# Patient Record
Sex: Male | Born: 1960 | Race: Black or African American | Hispanic: No | Marital: Single | Smoking: Never smoker
Health system: Southern US, Community
[De-identification: ages and names within clinical notes are randomized; demographics above are authoritative.]

## PROBLEM LIST (undated history)

## (undated) DIAGNOSIS — K4090 Unilateral inguinal hernia, without obstruction or gangrene, not specified as recurrent: Secondary | ICD-10-CM

## (undated) DIAGNOSIS — N529 Male erectile dysfunction, unspecified: Secondary | ICD-10-CM

## (undated) DIAGNOSIS — C61 Malignant neoplasm of prostate: Secondary | ICD-10-CM

## (undated) DIAGNOSIS — D696 Thrombocytopenia, unspecified: Secondary | ICD-10-CM

## (undated) DIAGNOSIS — I10 Essential (primary) hypertension: Secondary | ICD-10-CM

## (undated) HISTORY — PX: HERNIA REPAIR: SHX51

## (undated) HISTORY — DX: Essential (primary) hypertension: I10

## (undated) HISTORY — PX: PROSTATE BIOPSY: SHX241

---

## 2005-11-25 ENCOUNTER — Ambulatory Visit (HOSPITAL_BASED_OUTPATIENT_CLINIC_OR_DEPARTMENT_OTHER): Admission: RE | Admit: 2005-11-25 | Discharge: 2005-11-25 | Payer: Self-pay | Admitting: Surgery

## 2014-03-09 ENCOUNTER — Emergency Department (HOSPITAL_BASED_OUTPATIENT_CLINIC_OR_DEPARTMENT_OTHER)
Admission: EM | Admit: 2014-03-09 | Discharge: 2014-03-09 | Disposition: A | Discharge status: Home / Self Care | Payer: Managed Care, Other (non HMO) | Attending: Emergency Medicine | Admitting: Emergency Medicine

## 2014-03-09 ENCOUNTER — Encounter (HOSPITAL_BASED_OUTPATIENT_CLINIC_OR_DEPARTMENT_OTHER): Payer: Self-pay | Admitting: Emergency Medicine

## 2014-03-09 DIAGNOSIS — R197 Diarrhea, unspecified: Secondary | ICD-10-CM | POA: Insufficient documentation

## 2014-03-09 LAB — RAPID HIV SCREEN (WH-MAU): SUDS RAPID HIV SCREEN: NONREACTIVE

## 2014-03-09 LAB — CBC WITH DIFFERENTIAL/PLATELET
BASOS PCT: 0 % (ref 0–1)
Basophils Absolute: 0 10*3/uL (ref 0.0–0.1)
EOS ABS: 0.2 10*3/uL (ref 0.0–0.7)
Eosinophils Relative: 2 % (ref 0–5)
HCT: 38.5 % — ABNORMAL LOW (ref 39.0–52.0)
Hemoglobin: 12.8 g/dL — ABNORMAL LOW (ref 13.0–17.0)
Lymphocytes Relative: 8 % — ABNORMAL LOW (ref 12–46)
Lymphs Abs: 0.8 10*3/uL (ref 0.7–4.0)
MCH: 30.4 pg (ref 26.0–34.0)
MCHC: 33.2 g/dL (ref 30.0–36.0)
MCV: 91.4 fL (ref 78.0–100.0)
MONO ABS: 0.6 10*3/uL (ref 0.1–1.0)
Monocytes Relative: 6 % (ref 3–12)
NEUTROS ABS: 8.2 10*3/uL — AB (ref 1.7–7.7)
NEUTROS PCT: 84 % — AB (ref 43–77)
Platelets: 524 10*3/uL — ABNORMAL HIGH (ref 150–400)
RBC: 4.21 MIL/uL — ABNORMAL LOW (ref 4.22–5.81)
RDW: 12.9 % (ref 11.5–15.5)
WBC: 9.8 10*3/uL (ref 4.0–10.5)

## 2014-03-09 LAB — COMPREHENSIVE METABOLIC PANEL
ALBUMIN: 3.2 g/dL — AB (ref 3.5–5.2)
ALT: 46 U/L (ref 0–53)
ANION GAP: 12 (ref 5–15)
AST: 32 U/L (ref 0–37)
Alkaline Phosphatase: 65 U/L (ref 39–117)
BILIRUBIN TOTAL: 0.5 mg/dL (ref 0.3–1.2)
BUN: 9 mg/dL (ref 6–23)
CO2: 26 mEq/L (ref 19–32)
CREATININE: 1.2 mg/dL (ref 0.50–1.35)
Calcium: 9.4 mg/dL (ref 8.4–10.5)
Chloride: 105 mEq/L (ref 96–112)
GFR calc Af Amer: 78 mL/min — ABNORMAL LOW (ref >90)
GFR calc non Af Amer: 67 mL/min — ABNORMAL LOW (ref >90)
Glucose, Bld: 102 mg/dL — ABNORMAL HIGH (ref 70–99)
POTASSIUM: 3.6 meq/L — AB (ref 3.7–5.3)
Sodium: 143 mEq/L (ref 137–147)
TOTAL PROTEIN: 7.3 g/dL (ref 6.0–8.3)

## 2014-03-09 MED ORDER — POTASSIUM CHLORIDE CRYS ER 20 MEQ PO TBCR
40.0000 meq | EXTENDED_RELEASE_TABLET | Freq: Once | ORAL | Status: AC
  Administered 2014-03-09: 40 meq via ORAL
  Filled 2014-03-09: qty 2

## 2014-03-09 NOTE — ED Notes (Signed)
Pt has had diarrhea x3 weeks and has been seen several times for the same. Pt sts he was placed on antibiotics which he finished 1 week ago. Diarrhea returned this past Saturday.

## 2014-03-09 NOTE — Discharge Instructions (Signed)
Chronic Diarrhea Diarrhea is frequent loose and watery bowel movements. It can cause you to feel weak and dehydrated. Dehydration can cause you to become tired and thirsty and to have a dry mouth, decreased urination, and dark yellow urine. Diarrhea is a sign of another problem, most often an infection that will not last long. In most cases, diarrhea lasts 2-3 days. Diarrhea that lasts longer than 4 weeks is called long-lasting (chronic) diarrhea. It is important to treat your diarrhea as directed by your health care provider to lessen or prevent future episodes of diarrhea.  CAUSES  There are many causes of chronic diarrhea. The following are some possible causes:   Gastrointestinal infections caused by viruses, bacteria, or parasites.   Food poisoning or food allergies.   Certain medicines, such as antibiotics, chemotherapy, and laxatives.   Artificial sweeteners and fructose.   Digestive disorders, such as celiac disease and inflammatory bowel diseases.   Irritable bowel syndrome.  Some disorders of the pancreas.  Disorders of the thyroid.  Reduced blood flow to the intestines.  Cancer. Sometimes the cause of chronic diarrhea is unknown. RISK FACTORS  Having a severely weakened immune system, such as from HIV or AIDS.   Taking certain types of cancer-fighting drugs (such as with chemotherapy) or other medicines.   Having had a recent organ transplant.   Having a portion of the stomach or small bowel removed.   Traveling to countries where food and water supplies are often contaminated.  SYMPTOMS  In addition to frequent, loose stools, diarrhea may cause:   Cramping.   Abdominal pain.   Nausea.   Fever.  Fatigue.  Urgent need to use the bathroom.  Loss of bowel control. DIAGNOSIS  Your health care provider must take a careful history and perform a physical exam. Tests given are based on your symptoms and history. Tests may include:   Blood or  stool tests. Three or more stool samples may be examined. Stool cultures may be used to test for bacteria or parasites.   X-rays.   A procedure in which a thin tube is inserted into the mouth or rectum (endoscopy). This allows the health care provider to look inside the intestine.  TREATMENT   Treatment is aimed at correcting the cause of the diarrhea when possible.  Diarrhea caused by an infection can often be treated with antibiotic medicines.  Diarrhea not caused by an infection may require you to take long-term medicine or have surgery. Specific treatment should be discussed with your health care provider.  If the cause cannot be determined, treatment aims to relieve symptoms and prevent dehydration. Serious health problems can occur if you do not maintain proper fluid levels. Treatment may include:  Taking an oral rehydration solution (ORS).  Not drinking beverages that contain caffeine (such as tea, coffee, and soft drinks).  Not drinking alcohol.  Maintaining well-balanced nutrition to help you recover faster. HOME CARE INSTRUCTIONS   Drink enough fluids to keep urine clear or pale yellow. Drink 1 cup (8 oz) of fluid for each diarrhea episode. Avoid fluids that contain simple sugars, fruit juices, whole milk products, and sodas. Hydrate with an ORS. You may purchase the ORS or prepare it at home by mixing the following ingredients together:   - tsp (1.7-3  mL) table salt.   tsp (3  mL) baking soda.   tsp (1.7 mL) salt substitute containing potassium chloride.  1 tbsp (20 mL) sugar.  4.2 c (1 L) of water.  Certain foods and beverages may increase the speed at which food moves through the gastrointestinal (GI) tract. These foods and beverages should be avoided. They include:  Caffeinated and alcoholic beverages.  High-fiber foods, such as raw fruits and vegetables, nuts, seeds, and whole grain breads and cereals.  Foods and beverages sweetened with sugar  alcohols, such as xylitol, sorbitol, and mannitol.   Some foods may be well tolerated and may help thicken stool. These include:  Starchy foods, such as rice, toast, pasta, low-sugar cereal, oatmeal, grits, baked potatoes, crackers, and bagels.  Bananas.  Applesauce.  Add probiotic-rich foods to help increase healthy bacteria in the GI tract. These include yogurt and fermented milk products.  Wash your hands well after each diarrhea episode.  Only take over-the-counter or prescription medicines as directed by your health care provider.  Take a warm bath to relieve any burning or pain from frequent diarrhea episodes. SEEK MEDICAL CARE IF:   You are not urinating as often.  Your urine is a dark color.  You become very tired or dizzy.  You have severe pain in the abdomen or rectum.  Your have blood or pus in your stools.  Your stools look black and tarry. SEEK IMMEDIATE MEDICAL CARE IF:   You are unable to keep fluids down.  You have persistent vomiting.  You have blood in your stool.  Your stools are black and tarry.  You do not urinate in 6-8 hours, or there is only a small amount of very dark urine.  You have abdominal pain that increases or localizes.  You have weakness, dizziness, confusion, or lightheadedness.  You have a severe headache.  Your diarrhea gets worse or does not get better.  You have a fever or persistent symptoms for more than 2-3 days.  You have a fever and your symptoms suddenly get worse. MAKE SURE YOU:   Understand these instructions.  Will watch your condition.  Will get help right away if you are not doing well or get worse. Document Released: 09/21/2003 Document Revised: 07/06/2013 Document Reviewed: 12/24/2012 [facility] Patient Information 2015 [place], [place]. This information is not intended to replace advice given to you by your health care provider. Make sure you discuss any questions you have with your health care  provider.  Clostridium Difficile Toxin This is a test which may be done when a patient has diarrhea that lasts for several days, or has abdominal pain, fever, and nausea after antibiotic therapy.  This test looks for the presence of Clostridium difficile (C.diff.) toxin in a stool sample. C.diff. is a germ (bacterium) that is one of the groups of bacteria that are usually in the colon, called "normal flora." If something upsets the growth of the other normal flora, C.diff. may overgrow and disrupt the balance of bacteria in the colon. C. diff. may produce two toxins, A and B. The combination of overgrowth and toxins may cause prolonged diarrhea. The toxins may damage the lining of the colon and lead to colitis.  While some cases of C. diff. diarrhea and colitis do not require treatment, others require specific oral antibiotic therapy. Most patients improve as the normal flora re-establishes itself, but about some may have one or more relapses, with symptoms and detectible toxin levels coming back. PREPARATION FOR TEST There is no special preparation for the test. A fresh stool sample is collected in a sterile container. The sample should not be mixed with urine or water. The stool should be taken to the lab  within an hour. It may be refrigerated or frozen and taken to the lab as soon as possible. The container should be labeled with your name and the date and time of the stool collection.  NORMAL FINDINGS Negative Tissue Culture (no toxin identified) Ranges for normal findings may vary among different laboratories and hospitals. You should always check with your doctor after having lab work or other tests done to discuss the meaning of your test results and whether your values are considered within normal limits. MEANING OF TEST  Your caregiver will go over the test results with you and discuss the importance and meaning of your results, as well as treatment options and the need for additional tests if  necessary. OBTAINING THE TEST RESULTS It is your responsibility to obtain your test results. Ask the lab or department performing the test when and how you will get your results. Document Released: 07/24/2004 Document Revised: 09/23/2011 Document Reviewed: 06/09/2008 [facility] Patient Information 2015 [place], [place]. This information is not intended to replace advice given to you by your health care provider. Make sure you discuss any questions you have with your health care provider.

## 2014-03-09 NOTE — ED Notes (Signed)
MD at bedside. 

## 2014-03-09 NOTE — ED Provider Notes (Signed)
CSN: [id number]     Arrival date & time 03/09/14  1226 History   First MD Initiated Contact with Patient 03/09/14 1253     Chief Complaint  Patient presents with  . Diarrhea     (Consider location/radiation/quality/duration/timing/severity/associated sxs/prior Treatment) Patient is a 53 y.o. male presenting with diarrhea. The history is provided by the patient. No language interpreter was used.  Diarrhea Quality:  Watery Onset quality:  Gradual Number of episodes:  2-10 Duration:  3 weeks Timing:  Constant Progression since onset: waxing/waning. Relieved by: improved after a 3 days of cipro. Associated symptoms: no abdominal pain, no fever, no headaches and no vomiting   Associated symptoms comment:  Had crampy abdominal pain, nausea, subjective fever and chills about 3 weeks ago  Risk factors: recent antibiotic use and suspect food intake   Risk factors: no sick contacts and no travel to endemic areas     History reviewed. No pertinent past medical history. Past Surgical History  Procedure Laterality Date  . Hernia repair     No family history on file. History  Substance Use Topics  . Smoking status: Never Smoker   . Smokeless tobacco: Not on file  . Alcohol Use: Yes    Review of Systems  Constitutional: Negative for fever, activity change, appetite change and fatigue.  HENT: Negative for congestion, facial swelling, rhinorrhea and trouble swallowing.   Eyes: Negative for photophobia and pain.  Respiratory: Negative for cough, chest tightness and shortness of breath.   Cardiovascular: Negative for chest pain and leg swelling.  Gastrointestinal: Positive for diarrhea. Negative for nausea, vomiting, abdominal pain and constipation.  Endocrine: Negative for polydipsia and polyuria.  Genitourinary: Negative for dysuria, urgency, decreased urine volume and difficulty urinating.  Musculoskeletal: Negative for back pain and gait problem.  Skin: Negative for color change,  rash and wound.  Allergic/Immunologic: Negative for immunocompromised state.  Neurological: Negative for dizziness, facial asymmetry, speech difficulty, weakness, numbness and headaches.  Psychiatric/Behavioral: Negative for confusion, decreased concentration and agitation.      Allergies  Review of patient's allergies indicates no known allergies.  Home Medications   Prior to Admission medications   Not on File   BP 118/77  Pulse 84  Temp(Src) 98.2 F (36.8 C) (Oral)  Resp 16  Ht 6\' 4"  (1.93 m)  Wt 220 lb (99.791 kg)  BMI 26.79 kg/m2  SpO2 97% Physical Exam  Constitutional: He is oriented to person, place, and time. He appears well-developed and well-nourished. No distress.  HENT:  Head: Normocephalic and atraumatic.  Mouth/Throat: No oropharyngeal exudate.  Eyes: Pupils are equal, round, and reactive to light.  Neck: Normal range of motion. Neck supple.  Cardiovascular: Normal rate, regular rhythm and normal heart sounds.  Exam reveals no gallop and no friction rub.   No murmur heard. Pulmonary/Chest: Effort normal and breath sounds normal. No respiratory distress. He has no wheezes. He has no rales.  Abdominal: Soft. Bowel sounds are normal. He exhibits no distension and no mass. There is no tenderness. There is no rebound and no guarding.  Musculoskeletal: Normal range of motion. He exhibits no edema and no tenderness.  Neurological: He is alert and oriented to person, place, and time.  Skin: Skin is warm and dry.  Psychiatric: He has a normal mood and affect.    ED Course  Procedures (including critical care time) Labs Review Labs Reviewed  CBC WITH DIFFERENTIAL - Abnormal; Notable for the following:    RBC 4.21 (*)  Hemoglobin 12.8 (*)    HCT 38.5 (*)    Platelets 524 (*)    Neutrophils Relative % 84 (*)    Neutro Abs 8.2 (*)    Lymphocytes Relative 8 (*)    All other components within normal limits  COMPREHENSIVE METABOLIC PANEL - Abnormal; Notable for  the following:    Potassium 3.6 (*)    Glucose, Bld 102 (*)    Albumin 3.2 (*)    GFR calc non Af Amer 67 (*)    GFR calc Af Amer 78 (*)    All other components within normal limits  CLOSTRIDIUM DIFFICILE BY PCR  STOOL CULTURE  OVA AND PARASITE EXAMINATION  RAPID HIV SCREEN ([facility])    Imaging Review No results found.   EKG Interpretation None      MDM   Final diagnoses:  Diarrhea    Pt is a 54 y.o. male with Pmhx as above who presents with 3 weeks of watery d/a. This had resolved for a few days after a 3d course or cipro, but has reoccurred again 1 week ago, though not a severe. He reports abdominal cramping, fever, chills at beginning of illness.  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 He ate hibachi chicken the day symptoms started. No unusual water sources. On PE, VSS, pt in NAD. No abdominal ttp. CBC, CMP, rapid HIV stent. Will send stool cultures and cl diff if able to given staple.    K mildly low at 3.6, will replace given I suspect he will continue to have diarrhea. WBC nml, HIV nonreactive. Pt unable to give stool sample. Will d/c home with plan to return stool sample for giardia, EIA, O/P, Cl diff, culture. Return precautions given for new or worsening symptoms including fever, bloody stool, worsening pain. As he is well-appearing, and d/a seems to be improving, will not start further abx w/o stool testing.         Ernestina Patches, MD 03/09/14 [phone]

## 2016-01-12 ENCOUNTER — Ambulatory Visit: Payer: Self-pay | Admitting: General Surgery

## 2016-02-13 ENCOUNTER — Encounter (HOSPITAL_BASED_OUTPATIENT_CLINIC_OR_DEPARTMENT_OTHER): Payer: Self-pay | Admitting: *Deleted

## 2016-02-15 NOTE — Progress Notes (Signed)
Spoke with Mike Mullen at Dr. Richarda Blade office - will have to do labs day of surgery since pt lives in [place]. - ok to not have CXR done since he is first case of the day. Pt will need CBC,Diff, BMet and Ekg day of surgery.

## 2016-02-16 ENCOUNTER — Other Ambulatory Visit: Payer: Self-pay

## 2016-02-16 ENCOUNTER — Encounter (HOSPITAL_BASED_OUTPATIENT_CLINIC_OR_DEPARTMENT_OTHER)
Admission: RE | Admit: 2016-02-16 | Discharge: 2016-02-16 | Disposition: A | Discharge status: Home / Self Care | Payer: Managed Care, Other (non HMO) | Source: Ambulatory Visit | Attending: General Surgery | Admitting: General Surgery

## 2016-02-16 ENCOUNTER — Ambulatory Visit
Admission: RE | Admit: 2016-02-16 | Discharge: 2016-02-16 | Disposition: A | Discharge status: Home / Self Care | Payer: Managed Care, Other (non HMO) | Source: Ambulatory Visit | Attending: General Surgery | Admitting: General Surgery

## 2016-02-16 ENCOUNTER — Other Ambulatory Visit: Payer: Self-pay | Admitting: General Surgery

## 2016-02-16 DIAGNOSIS — K4090 Unilateral inguinal hernia, without obstruction or gangrene, not specified as recurrent: Secondary | ICD-10-CM | POA: Diagnosis present

## 2016-02-16 DIAGNOSIS — Z01811 Encounter for preprocedural respiratory examination: Secondary | ICD-10-CM

## 2016-02-16 LAB — CBC WITH DIFFERENTIAL/PLATELET
BASOS ABS: 0 10*3/uL (ref 0.0–0.1)
Basophils Relative: 0 %
EOS PCT: 2 %
Eosinophils Absolute: 0.1 10*3/uL (ref 0.0–0.7)
HCT: 43.8 % (ref 39.0–52.0)
Hemoglobin: 14.5 g/dL (ref 13.0–17.0)
Lymphocytes Relative: 22 %
Lymphs Abs: 1.2 10*3/uL (ref 0.7–4.0)
MCH: 30.3 pg (ref 26.0–34.0)
MCHC: 33.1 g/dL (ref 30.0–36.0)
MCV: 91.6 fL (ref 78.0–100.0)
MONO ABS: 0.6 10*3/uL (ref 0.1–1.0)
MONOS PCT: 11 %
NEUTROS ABS: 3.6 10*3/uL (ref 1.7–7.7)
NEUTROS PCT: 65 %
PLATELETS: 654 10*3/uL — AB (ref 150–400)
RBC: 4.78 MIL/uL (ref 4.22–5.81)
RDW: 12.8 % (ref 11.5–15.5)
WBC: 5.5 10*3/uL (ref 4.0–10.5)

## 2016-02-16 LAB — BASIC METABOLIC PANEL
ANION GAP: 7 (ref 5–15)
BUN: 11 mg/dL (ref 6–20)
CHLORIDE: 104 mmol/L (ref 101–111)
CO2: 27 mmol/L (ref 22–32)
CREATININE: 0.9 mg/dL (ref 0.61–1.24)
Calcium: 9.1 mg/dL (ref 8.9–10.3)
GFR calc non Af Amer: >60 mL/min (ref >60)
Glucose, Bld: 72 mg/dL (ref 65–99)
POTASSIUM: 4 mmol/L (ref 3.5–5.1)
SODIUM: 138 mmol/L (ref 135–145)

## 2016-02-19 ENCOUNTER — Ambulatory Visit (HOSPITAL_BASED_OUTPATIENT_CLINIC_OR_DEPARTMENT_OTHER)
Admission: RE | Admit: 2016-02-19 | Discharge: 2016-02-19 | Disposition: A | Discharge status: Home / Self Care | Payer: Managed Care, Other (non HMO) | Source: Ambulatory Visit | Attending: General Surgery | Admitting: General Surgery

## 2016-02-19 ENCOUNTER — Encounter (HOSPITAL_BASED_OUTPATIENT_CLINIC_OR_DEPARTMENT_OTHER)
Admission: RE | Disposition: A | Discharge status: Home / Self Care | Payer: Self-pay | Source: Ambulatory Visit | Attending: General Surgery

## 2016-02-19 ENCOUNTER — Encounter (HOSPITAL_BASED_OUTPATIENT_CLINIC_OR_DEPARTMENT_OTHER): Payer: Self-pay | Admitting: *Deleted

## 2016-02-19 ENCOUNTER — Ambulatory Visit (HOSPITAL_BASED_OUTPATIENT_CLINIC_OR_DEPARTMENT_OTHER): Payer: Managed Care, Other (non HMO) | Admitting: Anesthesiology

## 2016-02-19 DIAGNOSIS — K4090 Unilateral inguinal hernia, without obstruction or gangrene, not specified as recurrent: Secondary | ICD-10-CM | POA: Diagnosis not present

## 2016-02-19 DIAGNOSIS — Z01818 Encounter for other preprocedural examination: Secondary | ICD-10-CM

## 2016-02-19 HISTORY — DX: Unilateral inguinal hernia, without obstruction or gangrene, not specified as recurrent: K40.90

## 2016-02-19 HISTORY — PX: INSERTION OF MESH: SHX5868

## 2016-02-19 HISTORY — PX: INGUINAL HERNIA REPAIR: SHX194

## 2016-02-19 SURGERY — REPAIR, HERNIA, INGUINAL, ADULT
Anesthesia: Regional | Site: Inguinal | Laterality: Left

## 2016-02-19 MED ORDER — ACETAMINOPHEN 500 MG PO TABS
1000.0000 mg | ORAL_TABLET | ORAL | Status: AC
  Administered 2016-02-19: 1000 mg via ORAL

## 2016-02-19 MED ORDER — MIDAZOLAM HCL 2 MG/2ML IJ SOLN: 1.0000 mg | INTRAMUSCULAR | Status: DC | PRN

## 2016-02-19 MED ORDER — SODIUM BICARBONATE 4 % IV SOLN
INTRAVENOUS | Status: AC
  Filled 2016-02-19: qty 5

## 2016-02-19 MED ORDER — LIDOCAINE 2% (20 MG/ML) 5 ML SYRINGE
INTRAMUSCULAR | Status: AC
  Filled 2016-02-19: qty 5

## 2016-02-19 MED ORDER — LIDOCAINE HCL (CARDIAC) 20 MG/ML IV SOLN
INTRAVENOUS | Status: DC | PRN
  Administered 2016-02-19: 100 mg via INTRAVENOUS

## 2016-02-19 MED ORDER — ONDANSETRON HCL 4 MG/2ML IJ SOLN
INTRAMUSCULAR | Status: AC
  Filled 2016-02-19: qty 2

## 2016-02-19 MED ORDER — PROPOFOL 500 MG/50ML IV EMUL
INTRAVENOUS | Status: DC | PRN
  Administered 2016-02-19: 50 mL via INTRAVENOUS
  Administered 2016-02-19: 300 mL via INTRAVENOUS

## 2016-02-19 MED ORDER — FENTANYL CITRATE (PF) 100 MCG/2ML IJ SOLN
50.0000 ug | INTRAMUSCULAR | Status: DC | PRN
  Administered 2016-02-19: 50 ug via INTRAVENOUS

## 2016-02-19 MED ORDER — PROPOFOL 10 MG/ML IV BOLUS
INTRAVENOUS | Status: AC
  Filled 2016-02-19: qty 40

## 2016-02-19 MED ORDER — OXYCODONE HCL 5 MG/5ML PO SOLN: 5.0000 mg | Freq: Once | ORAL | Status: AC | PRN

## 2016-02-19 MED ORDER — MIDAZOLAM HCL 2 MG/2ML IJ SOLN
INTRAMUSCULAR | Status: AC
  Filled 2016-02-19: qty 2

## 2016-02-19 MED ORDER — LIDOCAINE HCL (PF) 1 % IJ SOLN
INTRAMUSCULAR | Status: AC
  Filled 2016-02-19: qty 30

## 2016-02-19 MED ORDER — DEXAMETHASONE SODIUM PHOSPHATE 10 MG/ML IJ SOLN
INTRAMUSCULAR | Status: AC
  Filled 2016-02-19: qty 1

## 2016-02-19 MED ORDER — BUPIVACAINE HCL (PF) 0.5 % IJ SOLN
INTRAMUSCULAR | Status: DC | PRN
  Administered 2016-02-19: 18 mL

## 2016-02-19 MED ORDER — FENTANYL CITRATE (PF) 100 MCG/2ML IJ SOLN
INTRAMUSCULAR | Status: AC
  Filled 2016-02-19: qty 2

## 2016-02-19 MED ORDER — SCOPOLAMINE 1 MG/3DAYS TD PT72
MEDICATED_PATCH | TRANSDERMAL | Status: AC
  Filled 2016-02-19: qty 1

## 2016-02-19 MED ORDER — CHLORHEXIDINE GLUCONATE CLOTH 2 % EX PADS: 6.0000 | MEDICATED_PAD | Freq: Once | CUTANEOUS | Status: DC

## 2016-02-19 MED ORDER — SCOPOLAMINE 1 MG/3DAYS TD PT72: 1.0000 | MEDICATED_PATCH | Freq: Once | TRANSDERMAL | Status: DC | PRN

## 2016-02-19 MED ORDER — CEFAZOLIN SODIUM-DEXTROSE 2-4 GM/100ML-% IV SOLN
2.0000 g | INTRAVENOUS | Status: AC
  Administered 2016-02-19: 2 g via INTRAVENOUS

## 2016-02-19 MED ORDER — MIDAZOLAM HCL 5 MG/5ML IJ SOLN
INTRAMUSCULAR | Status: DC | PRN
  Administered 2016-02-19: 2 mg via INTRAVENOUS

## 2016-02-19 MED ORDER — CELECOXIB 200 MG PO CAPS
ORAL_CAPSULE | ORAL | Status: AC
  Filled 2016-02-19: qty 2

## 2016-02-19 MED ORDER — FENTANYL CITRATE (PF) 100 MCG/2ML IJ SOLN
INTRAMUSCULAR | Status: DC | PRN
  Administered 2016-02-19: 100 ug via INTRAVENOUS
  Administered 2016-02-19: 50 ug via INTRAVENOUS

## 2016-02-19 MED ORDER — LACTATED RINGERS IV SOLN
INTRAVENOUS | Status: DC
  Administered 2016-02-19 (×2): via INTRAVENOUS

## 2016-02-19 MED ORDER — OXYCODONE HCL 5 MG PO TABS
5.0000 mg | ORAL_TABLET | Freq: Once | ORAL | Status: AC | PRN
  Administered 2016-02-19: 5 mg via ORAL

## 2016-02-19 MED ORDER — DEXAMETHASONE SODIUM PHOSPHATE 10 MG/ML IJ SOLN
INTRAMUSCULAR | Status: DC | PRN
  Administered 2016-02-19: 10 mg via INTRAVENOUS

## 2016-02-19 MED ORDER — GLYCOPYRROLATE 0.2 MG/ML IJ SOLN: 0.2000 mg | Freq: Once | INTRAMUSCULAR | Status: DC | PRN

## 2016-02-19 MED ORDER — ONDANSETRON HCL 4 MG/2ML IJ SOLN
INTRAMUSCULAR | Status: DC | PRN
Start: 2016-02-19 — End: 2016-02-19
  Administered 2016-02-19: 4 mg via INTRAVENOUS

## 2016-02-19 MED ORDER — FENTANYL CITRATE (PF) 100 MCG/2ML IJ SOLN
INTRAMUSCULAR | Status: AC
  Filled 2016-02-19: qty 4

## 2016-02-19 MED ORDER — ACETAMINOPHEN 500 MG PO TABS
ORAL_TABLET | ORAL | Status: AC
  Filled 2016-02-19: qty 2

## 2016-02-19 MED ORDER — HYDROCODONE-ACETAMINOPHEN 5-325 MG PO TABS: 1.0000 | ORAL_TABLET | Freq: Four times a day (QID) | ORAL | 0 refills | Status: DC | PRN

## 2016-02-19 MED ORDER — CEFAZOLIN SODIUM-DEXTROSE 2-4 GM/100ML-% IV SOLN
INTRAVENOUS | Status: AC
  Filled 2016-02-19: qty 100

## 2016-02-19 MED ORDER — HYDROMORPHONE HCL 1 MG/ML IJ SOLN: 0.2500 mg | INTRAMUSCULAR | Status: DC | PRN

## 2016-02-19 MED ORDER — BUPIVACAINE HCL (PF) 0.5 % IJ SOLN
INTRAMUSCULAR | Status: AC
  Filled 2016-02-19: qty 30

## 2016-02-19 MED ORDER — MEPERIDINE HCL 25 MG/ML IJ SOLN: 6.2500 mg | INTRAMUSCULAR | Status: DC | PRN

## 2016-02-19 MED ORDER — SODIUM CHLORIDE 0.9 % IR SOLN
Status: DC | PRN
  Administered 2016-02-19: 200 mL

## 2016-02-19 MED ORDER — CELECOXIB 400 MG PO CAPS
400.0000 mg | ORAL_CAPSULE | ORAL | Status: AC
  Administered 2016-02-19: 400 mg via ORAL

## 2016-02-19 MED ORDER — PROMETHAZINE HCL 25 MG/ML IJ SOLN: 6.2500 mg | INTRAMUSCULAR | Status: DC | PRN

## 2016-02-19 MED ORDER — OXYCODONE HCL 5 MG PO TABS
ORAL_TABLET | ORAL | Status: AC
  Filled 2016-02-19: qty 1

## 2016-02-19 SURGICAL SUPPLY — 55 items
BAG DECANTER FOR FLEXI CONT (MISCELLANEOUS) ×3 IMPLANT
BLADE CLIPPER SURG (BLADE) ×3 IMPLANT
BLADE SURG 10 STRL SS (BLADE) ×3 IMPLANT
BLADE SURG 15 STRL LF DISP TIS (BLADE) ×2 IMPLANT
BLADE SURG 15 STRL SS (BLADE) ×4
CANISTER SUCT 1200ML W/VALVE (MISCELLANEOUS) IMPLANT
CHLORAPREP W/TINT 26ML (MISCELLANEOUS) ×3 IMPLANT
CLEANER CAUTERY TIP 5X5 PAD (MISCELLANEOUS) ×1 IMPLANT
CLOSURE WOUND 1/2 X4 (GAUZE/BANDAGES/DRESSINGS) ×1
COVER BACK TABLE 60X90IN (DRAPES) ×3 IMPLANT
COVER MAYO STAND STRL (DRAPES) ×3 IMPLANT
DECANTER SPIKE VIAL GLASS SM (MISCELLANEOUS) ×3 IMPLANT
DERMABOND ADVANCED (GAUZE/BANDAGES/DRESSINGS) ×2
DERMABOND ADVANCED .7 DNX12 (GAUZE/BANDAGES/DRESSINGS) ×1 IMPLANT
DRAIN PENROSE 1/2X12 LTX STRL (WOUND CARE) ×3 IMPLANT
DRAPE LAPAROTOMY TRNSV 102X78 (DRAPE) ×3 IMPLANT
DRAPE UTILITY XL STRL (DRAPES) ×3 IMPLANT
DRSG TEGADERM 2-3/8X2-3/4 SM (GAUZE/BANDAGES/DRESSINGS) IMPLANT
DRSG TEGADERM 4X4.75 (GAUZE/BANDAGES/DRESSINGS) ×3 IMPLANT
ELECT REM PT RETURN 9FT ADLT (ELECTROSURGICAL) ×3
ELECTRODE REM PT RTRN 9FT ADLT (ELECTROSURGICAL) ×1 IMPLANT
GLOVE BIOGEL PI IND STRL 7.0 (GLOVE) ×2 IMPLANT
GLOVE BIOGEL PI IND STRL 8 (GLOVE) ×1 IMPLANT
GLOVE BIOGEL PI INDICATOR 7.0 (GLOVE) ×4
GLOVE BIOGEL PI INDICATOR 8 (GLOVE) ×2
GLOVE ECLIPSE 7.5 STRL STRAW (GLOVE) ×3 IMPLANT
GLOVE SURG SS PI 6.5 STRL IVOR (GLOVE) ×3 IMPLANT
GOWN STRL REUS W/ TWL LRG LVL3 (GOWN DISPOSABLE) ×1 IMPLANT
GOWN STRL REUS W/ TWL XL LVL3 (GOWN DISPOSABLE) ×1 IMPLANT
GOWN STRL REUS W/TWL LRG LVL3 (GOWN DISPOSABLE) ×2
GOWN STRL REUS W/TWL XL LVL3 (GOWN DISPOSABLE) ×2
MESH HERNIA 3X6 (Mesh General) ×3 IMPLANT
NEEDLE HYPO 25X1 1.5 SAFETY (NEEDLE) ×3 IMPLANT
NS IRRIG 1000ML POUR BTL (IV SOLUTION) IMPLANT
PACK BASIN DAY SURGERY FS (CUSTOM PROCEDURE TRAY) ×3 IMPLANT
PAD CLEANER CAUTERY TIP 5X5 (MISCELLANEOUS) ×2
PENCIL BUTTON HOLSTER BLD 10FT (ELECTRODE) ×3 IMPLANT
SLEEVE SCD COMPRESS KNEE MED (MISCELLANEOUS) ×3 IMPLANT
SPONGE INTESTINAL PEANUT (DISPOSABLE) ×3 IMPLANT
SPONGE LAP 4X18 X RAY DECT (DISPOSABLE) ×3 IMPLANT
STRIP CLOSURE SKIN 1/2X4 (GAUZE/BANDAGES/DRESSINGS) ×2 IMPLANT
SUT ETHIBOND 0 MO6 C/R (SUTURE) ×3 IMPLANT
SUT MON AB 4-0 PC3 18 (SUTURE) ×3 IMPLANT
SUT PROLENE 0 CT 2 (SUTURE) ×6 IMPLANT
SUT VIC AB 3-0 SH 27 (SUTURE) ×4
SUT VIC AB 3-0 SH 27X BRD (SUTURE) ×2 IMPLANT
SUT VICRYL 4-0 PS2 18IN ABS (SUTURE) IMPLANT
SUT VICRYL AB 3 0 TIES (SUTURE) IMPLANT
SYR BULB 3OZ (MISCELLANEOUS) ×3 IMPLANT
SYR CONTROL 10ML LL (SYRINGE) ×3 IMPLANT
TOWEL OR 17X24 6PK STRL BLUE (TOWEL DISPOSABLE) ×3 IMPLANT
TOWEL OR NON WOVEN STRL DISP B (DISPOSABLE) ×3 IMPLANT
TUBE CONNECTING 20'X1/4 (TUBING)
TUBE CONNECTING 20X1/4 (TUBING) IMPLANT
YANKAUER SUCT BULB TIP NO VENT (SUCTIONS) IMPLANT

## 2016-02-19 NOTE — Transfer of Care (Signed)
Immediate Anesthesia Transfer of Care Note  Patient: Mike Mullen  Procedure(s) Performed: Procedure(s): OPEN LEFT INGUINAL HERNIA REPAIR (Left) INSERTION OF MESH (Left)  Patient Location: PACU  Anesthesia Type:General  Level of Consciousness: awake, alert , oriented, sedated and patient cooperative  Airway & Oxygen Therapy: Patient Spontanous Breathing and Patient connected to face mask oxygen  Post-op Assessment: Report given to RN and Post -op Vital signs reviewed and stable  Post vital signs: Reviewed and stable  Last Vitals:  Vitals:   02/19/16 0643  BP: (!) 153/84  Pulse: 75  Resp: 20  Temp: 36.8 C    Last Pain:  Vitals:   02/19/16 0643  TempSrc: Oral         Complications: No apparent anesthesia complications

## 2016-02-19 NOTE — H&P (Signed)
Mike Mullen is an 55 y.o. male.   Chief Complaint: Left inguinal hernia  HPI: symptomatic LIH seen in office.  Reducible, previous [facility] repair laparoscopically.  Past Medical History:  Diagnosis Date  . Inguinal hernia    left    Past Surgical History:  Procedure Laterality Date  . HERNIA REPAIR      History reviewed. No pertinent family history. Social History:  reports that he has never smoked. He has never used smokeless tobacco. He reports that he drinks alcohol. He reports that he does not use drugs.  Allergies: No Known Allergies  No prescriptions prior to admission.    No results found for this or any previous visit (from the past 48 hour(s)). No results found.  Review of Systems  All other systems reviewed and are negative.   Blood pressure (!) 153/84, pulse 75, temperature 98.3 F (36.8 C), temperature source Oral, resp. rate 20, height 6\' 4"  (1.93 m), weight 111.1 kg (245 lb), SpO2 100 %. Physical Exam  Constitutional: He is oriented to person, place, and time. He appears well-developed and well-nourished.  HENT:  Head: Normocephalic and atraumatic.  Neck: Normal range of motion. Neck supple.  Cardiovascular: Normal rate, regular rhythm and normal heart sounds.   No murmur heard. Respiratory: Effort normal and breath sounds normal. No respiratory distress.  GI: Soft. Bowel sounds are normal. A hernia is present. Hernia confirmed positive in the left inguinal area.    Musculoskeletal: Normal range of motion.  Neurological: He is alert and oriented to person, place, and time. He has normal reflexes.  Skin: Skin is warm.  Psychiatric: He has a normal mood and affect. His behavior is normal. Judgment and thought content normal.     Assessment/Plan Symptomatic LIH for open repair today. Patient understands the risks and benefits and wishes to proceed.  Judeth Horn, MD 02/19/2016, 7:37 AM

## 2016-02-19 NOTE — Discharge Instructions (Addendum)
PATIENT INSTRUCTIONS HERNIA  FOLLOW-UP:  Please make an appointment with your physician in 2 week(s).  Call your physician immediately if you have any fevers greater than 102.5, drainage from you wound that is not clear or looks infected, persistent bleeding, increasing abdominal pain, problems urinating, or persistent nausea/vomiting.    WOUND CARE INSTRUCTIONS:  You should keep the initial dressing intact until seen in clinic.  You may shower right away.  Leave the plastic intact If the wound becomes bright red and painful or starts to drain infected material that is not clear, please contact your physician immediately.  If the wound is mildly pink and has a thick firm ridge underneath it, this is normal, and is referred to as a healing ridge.  This will resolve over the next 4-6 weeks.  DIET:  You may eat any foods that you can tolerate.  It is a good idea to eat a high fiber diet and take in plenty of fluids to prevent constipation.  If you do become constipated you may want to take a mild laxative or take ducolax tablets on a daily basis until your bowel habits are regular.  Constipation can be very uncomfortable, along with straining, after recent abdominal surgery.  ACTIVITY:  You are encouraged to cough and deep breath or use your incentive spirometer if you were given one, every 15-30 minutes when awake.  This will help prevent respiratory complications and low grade fevers post-operatively.  You may want to hug a pillow when coughing and sneezing to add additional support to the surgical area which will decrease pain during these times.  You are encouraged to walk and engage in light activity for the next two weeks.  You should not lift more than 20 pounds during this time frame as it could put you at increased risk for a hernia recurrence.  Twenty pounds is roughly equivalent to a plastic bag of groceries.    MEDICATIONS:  Try to take narcotic medications and anti-inflammatory medications, such  as tylenol, ibuprofen, naprosyn, etc., with food.  This will minimize stomach upset from the medication.  Should you develop nausea and vomiting from the pain medication, or develop a rash, please discontinue the medication and contact your physician.  You should not drive, make important decisions, or operate machinery when taking narcotic pain medication.  QUESTIONS:  Please feel free to call your physician or the hospital operator if you have any questions, and they will be glad to assist you.    Post Anesthesia Home Care Instructions  Activity: Get plenty of rest for the remainder of the day. A responsible adult should stay with you for 24 hours following the procedure.  For the next 24 hours, DO NOT: -Drive a car -Paediatric nurse -Drink alcoholic beverages -Take any medication unless instructed by your physician -Make any legal decisions or sign important papers.  Meals: Start with liquid foods such as gelatin or soup. Progress to regular foods as tolerated. Avoid greasy, spicy, heavy foods. If nausea and/or vomiting occur, drink only clear liquids until the nausea and/or vomiting subsides. Call your physician if vomiting continues.  Special Instructions/Symptoms: Your throat may feel dry or sore from the anesthesia or the breathing tube placed in your throat during surgery. If this causes discomfort, gargle with warm salt water. The discomfort should disappear within 24 hours.  If you had a scopolamine patch placed behind your ear for the management of post- operative nausea and/or vomiting:  1. The medication in the patch  is effective for 72 hours, after which it should be removed.  Wrap patch in a tissue and discard in the trash. Wash hands thoroughly with soap and water. 2. You may remove the patch earlier than 72 hours if you experience unpleasant side effects which may include dry mouth, dizziness or visual disturbances. 3. Avoid touching the patch. Wash your hands with soap  and water after contact with the patch.   Call your surgeon if you experience:   1.  Fever over 101.0. 2.  Inability to urinate. 3.  Nausea and/or vomiting. 4.  Extreme swelling or bruising at the surgical site. 5.  Continued bleeding from the incision. 6.  Increased pain, redness or drainage from the incision. 7.  Problems related to your pain medication. 8.  Any problems and/or concerns

## 2016-02-19 NOTE — Anesthesia Postprocedure Evaluation (Signed)
Anesthesia Post Note  Patient: Mike Mullen  Procedure(s) Performed: Procedure(s) (LRB): OPEN LEFT INGUINAL HERNIA REPAIR (Left) INSERTION OF MESH (Left)  Patient location during evaluation: PACU Anesthesia Type: General Level of consciousness: sedated and patient cooperative Pain management: pain level controlled Vital Signs Assessment: post-procedure vital signs reviewed and stable Respiratory status: spontaneous breathing Cardiovascular status: stable Anesthetic complications: no    Last Vitals:  Vitals:   02/19/16 0950 02/19/16 1037  BP:  (!) 157/86  Pulse: 94 87  Resp: 13 16  Temp:  37 C    Last Pain:  Vitals:   02/19/16 1037  TempSrc:   PainSc: [place]

## 2016-02-19 NOTE — Anesthesia Procedure Notes (Signed)
Procedure Name: LMA Insertion Date/Time: 02/19/2016 7:47 AM Performed by: Wanita Chamberlain Pre-anesthesia Checklist: Patient identified, Timeout performed, Emergency Drugs available, Suction available and Patient being monitored Patient Re-evaluated:Patient Re-evaluated prior to inductionOxygen Delivery Method: Circle system utilized Preoxygenation: Pre-oxygenation with 100% oxygen Intubation Type: IV induction Ventilation: Mask ventilation without difficulty LMA: LMA inserted LMA Size: 5.0

## 2016-02-19 NOTE — Brief Op Note (Signed)
02/19/2016  9:12 AM  PATIENT:  Mike Mullen  55 y.o. male  PRE-OPERATIVE DIAGNOSIS:  Symptomatic left inguinal hernia  POST-OPERATIVE DIAGNOSIS:  Symptomatic left inguinal hernia  PROCEDURE:  Procedure(s): OPEN LEFT INGUINAL HERNIA REPAIR (Left)  SURGEON:  Surgeon(s) and Role:    * Judeth Horn, MD - Primary  PHYSICIAN ASSISTANT:   ASSISTANTS: none   ANESTHESIA:   general and LMA  EBL:  Total I/O In: 800 [I.V.:800] Out: -   BLOOD ADMINISTERED:none  DRAINS: none   LOCAL MEDICATIONS USED:  MARCAINE     SPECIMEN:  No Specimen  DISPOSITION OF SPECIMEN:  None  COUNTS:  YES  TOURNIQUET:  * No tourniquets in log *  DICTATION: .Dragon Dictation  PLAN OF CARE: Discharge to home after PACU  PATIENT DISPOSITION:  PACU - hemodynamically stable.   Delay start of Pharmacological VTE agent (>24hrs) due to surgical blood loss or risk of bleeding: no

## 2016-02-19 NOTE — Op Note (Signed)
OPERATIVE REPORT  DATE OF OPERATION: 02/19/2016  PATIENT:  Mike Mullen  55 y.o. male  PRE-OPERATIVE DIAGNOSIS:  Symptomatic left inguinal hernia  POST-OPERATIVE DIAGNOSIS:  Symptomatic left inguinal hernia  FINDINGS:  Direct left inguinal hernia  PROCEDURE:  Procedure(s): OPEN LEFT INGUINAL HERNIA REPAIR  SURGEON:  Surgeon(s): Judeth Horn, MD  ASSISTANT: None  ANESTHESIA:   general and LMA  COMPLICATIONS:  None  EBL: <20 ml  BLOOD ADMINISTERED: none  DRAINS: none   SPECIMEN:  No Specimen  COUNTS CORRECT:  YES  PROCEDURE DETAILS: The patient was taken to the operating room and placed on the table in the supine position. After an adequate general laryngeal airway anesthetic was administered he was prepped and draped in usual sterile manner exposing the left inguinal area.  A proper timeout was performed identifying the patient and procedure to be performed which been marked preoperatively. We marked on the patient's skin the area of incision which was a curvilinear incision at the level of the superficial ring. We took it down into the subcutaneous tissue using electrocautery ligating a lateral subcutaneous vein with 3-0 Vicryl. We exposed the external oblique fascia which was then opened to its fibers down through the superficial ring exposing the spermatic cord. The processes opening the superficial ring the ilioinguinal nerve was exposed and we cut it because it was directly involved with inflammation along the hernia itself.  We mobilized the spermatic cord with a Penrose drain and then subsequently identified there was no evidence of an indirect sac but a large direct sac which we subsequently repaired.  In order to keep it out of the way we imbricated the direct sac on itself using 0 Ethibond sutures. We then reinforced the hernia repair placing an oval piece of polypropylene mesh which been soaked in antibiotic solution and measured 3 x 5 cm in size. We attached to the  pubic tubercle and then attached to the reflected portion of the inguinal ligament inferiorly laterally and the conjoined tendon anterior medially.  Once this was done we irrigated with antibiotic solution. The external oblique fascia was closed using running 3-0 Vicryl suture and Scarpa's fascia was reapproximated using interrupted 3-0 Vicryl. 0.25% Marcaine without epinephrine neb from was injected into the wound and at the anterior superior iliac spine. The skin was closed using running subcuticular stitch of 4-0 Monocryl. Dermabond Steri-Strips and Tegaderm used to complete our dressings. All needle counts, sponge counts, and instrument counts were correct.  PATIENT DISPOSITION:  PACU - hemodynamically stable.   Lema Heinkel 8/7/20179:13 AM

## 2016-02-19 NOTE — Anesthesia Preprocedure Evaluation (Addendum)
Anesthesia Evaluation  Patient identified by MRN, date of birth, ID band Patient awake    Reviewed: Allergy & Precautions, NPO status , Patient's Chart, lab work & pertinent test results  Airway Mallampati: II  TM Distance: >3 FB Neck ROM: Full    Dental no notable dental hx. (+) Teeth Intact, Dental Advisory Given   Pulmonary neg pulmonary ROS,    Pulmonary exam normal breath sounds clear to auscultation       Cardiovascular Exercise Tolerance: Good negative cardio ROS Normal cardiovascular exam Rhythm:Regular Rate:Normal     Neuro/Psych negative neurological ROS  negative psych ROS   GI/Hepatic negative GI ROS, Neg liver ROS,   Endo/Other  negative endocrine ROS  Renal/GU negative Renal ROS     Musculoskeletal negative musculoskeletal ROS (+)   Abdominal   Peds  Hematology negative hematology ROS (+)   Anesthesia Other Findings   Reproductive/Obstetrics negative OB ROS                           Anesthesia Physical Anesthesia Plan  ASA: I  Anesthesia Plan: General   Post-op Pain Management:    Induction: Intravenous  Airway Management Planned: LMA  Additional Equipment:   Intra-op Plan:   Post-operative Plan: Extubation in OR  Informed Consent: I have reviewed the patients History and Physical, chart, labs and discussed the procedure including the risks, benefits and alternatives for the proposed anesthesia with the patient or authorized representative who has indicated his/her understanding and acceptance.   Dental advisory given  Plan Discussed with: CRNA and Anesthesiologist  Anesthesia Plan Comments:       Anesthesia Quick Evaluation

## 2016-02-21 ENCOUNTER — Encounter (HOSPITAL_BASED_OUTPATIENT_CLINIC_OR_DEPARTMENT_OTHER): Payer: Self-pay | Admitting: General Surgery

## 2016-09-06 ENCOUNTER — Other Ambulatory Visit: Payer: Self-pay | Admitting: Neurosurgery

## 2016-09-06 DIAGNOSIS — M5416 Radiculopathy, lumbar region: Secondary | ICD-10-CM

## 2016-09-16 ENCOUNTER — Ambulatory Visit
Admission: RE | Admit: 2016-09-16 | Discharge: 2016-09-16 | Disposition: A | Discharge status: Home / Self Care | Payer: BLUE CROSS/BLUE SHIELD | Source: Ambulatory Visit | Attending: Neurosurgery | Admitting: Neurosurgery

## 2016-09-16 DIAGNOSIS — M5416 Radiculopathy, lumbar region: Secondary | ICD-10-CM

## 2016-09-16 MED ORDER — IOPAMIDOL (ISOVUE-M 200) INJECTION 41%
15.0000 mL | Freq: Once | INTRAMUSCULAR | Status: AC
  Administered 2016-09-16: 15 mL via INTRATHECAL

## 2016-09-16 MED ORDER — DIAZEPAM 5 MG PO TABS
10.0000 mg | ORAL_TABLET | Freq: Once | ORAL | Status: AC
  Administered 2016-09-16: 10 mg via ORAL

## 2016-09-16 MED ORDER — ONDANSETRON HCL 4 MG/2ML IJ SOLN: 4.0000 mg | Freq: Four times a day (QID) | INTRAMUSCULAR | Status: DC | PRN

## 2016-09-16 NOTE — Discharge Instructions (Signed)

## 2016-09-18 ENCOUNTER — Other Ambulatory Visit: Payer: Self-pay | Admitting: Neurosurgery

## 2016-09-18 DIAGNOSIS — M5416 Radiculopathy, lumbar region: Secondary | ICD-10-CM

## 2016-10-21 ENCOUNTER — Ambulatory Visit
Admission: RE | Admit: 2016-10-21 | Discharge: 2016-10-21 | Disposition: A | Discharge status: Home / Self Care | Payer: BLUE CROSS/BLUE SHIELD | Source: Ambulatory Visit | Attending: Neurosurgery | Admitting: Neurosurgery

## 2016-10-21 DIAGNOSIS — M5416 Radiculopathy, lumbar region: Secondary | ICD-10-CM

## 2016-10-21 MED ORDER — IOPAMIDOL (ISOVUE-M 200) INJECTION 41%
1.0000 mL | Freq: Once | INTRAMUSCULAR | Status: AC
  Administered 2016-10-21: 1 mL via EPIDURAL

## 2016-10-21 MED ORDER — METHYLPREDNISOLONE ACETATE 40 MG/ML INJ SUSP (RADIOLOG
120.0000 mg | Freq: Once | INTRAMUSCULAR | Status: AC
  Administered 2016-10-21: 120 mg via EPIDURAL

## 2016-10-21 NOTE — Discharge Instructions (Signed)

## 2016-11-01 ENCOUNTER — Other Ambulatory Visit: Payer: Self-pay | Admitting: Neurosurgery

## 2016-11-01 DIAGNOSIS — M5416 Radiculopathy, lumbar region: Secondary | ICD-10-CM

## 2016-11-25 ENCOUNTER — Ambulatory Visit
Admission: RE | Admit: 2016-11-25 | Discharge: 2016-11-25 | Disposition: A | Discharge status: Home / Self Care | Payer: BLUE CROSS/BLUE SHIELD | Source: Ambulatory Visit | Attending: Neurosurgery | Admitting: Neurosurgery

## 2016-11-25 DIAGNOSIS — M5416 Radiculopathy, lumbar region: Secondary | ICD-10-CM

## 2016-11-25 MED ORDER — IOPAMIDOL (ISOVUE-M 200) INJECTION 41%
1.0000 mL | Freq: Once | INTRAMUSCULAR | Status: AC
  Administered 2016-11-25: 1 mL via EPIDURAL

## 2016-11-25 MED ORDER — METHYLPREDNISOLONE ACETATE 40 MG/ML INJ SUSP (RADIOLOG
120.0000 mg | Freq: Once | INTRAMUSCULAR | Status: AC
  Administered 2016-11-25: 120 mg via EPIDURAL

## 2017-06-27 ENCOUNTER — Other Ambulatory Visit: Payer: Self-pay | Admitting: Student

## 2017-06-27 DIAGNOSIS — M5416 Radiculopathy, lumbar region: Secondary | ICD-10-CM

## 2017-08-11 ENCOUNTER — Ambulatory Visit
Admission: RE | Admit: 2017-08-11 | Discharge: 2017-08-11 | Disposition: A | Discharge status: Home / Self Care | Payer: BLUE CROSS/BLUE SHIELD | Source: Ambulatory Visit | Attending: Student | Admitting: Student

## 2017-08-11 DIAGNOSIS — M5416 Radiculopathy, lumbar region: Secondary | ICD-10-CM

## 2017-08-11 MED ORDER — IOPAMIDOL (ISOVUE-M 200) INJECTION 41%
1.0000 mL | Freq: Once | INTRAMUSCULAR | Status: AC
  Administered 2017-08-11: 1 mL via EPIDURAL

## 2017-08-11 MED ORDER — METHYLPREDNISOLONE ACETATE 40 MG/ML INJ SUSP (RADIOLOG
120.0000 mg | Freq: Once | INTRAMUSCULAR | Status: AC
  Administered 2017-08-11: 120 mg via EPIDURAL

## 2017-08-11 NOTE — Discharge Instructions (Signed)

## 2021-11-15 ENCOUNTER — Other Ambulatory Visit: Payer: Self-pay | Admitting: Oncology

## 2021-11-15 ENCOUNTER — Emergency Department (HOSPITAL_COMMUNITY): Payer: 59

## 2021-11-15 ENCOUNTER — Emergency Department (HOSPITAL_COMMUNITY)
Admission: EM | Admit: 2021-11-15 | Discharge: 2021-11-15 | Disposition: A | Discharge status: Home / Self Care | Payer: 59 | Attending: Emergency Medicine | Admitting: Emergency Medicine

## 2021-11-15 ENCOUNTER — Telehealth: Payer: Self-pay | Admitting: Hematology

## 2021-11-15 ENCOUNTER — Other Ambulatory Visit: Payer: Self-pay

## 2021-11-15 ENCOUNTER — Other Ambulatory Visit (HOSPITAL_COMMUNITY): Payer: Self-pay

## 2021-11-15 DIAGNOSIS — D75839 Thrombocytosis, unspecified: Secondary | ICD-10-CM | POA: Diagnosis not present

## 2021-11-15 DIAGNOSIS — R0789 Other chest pain: Secondary | ICD-10-CM | POA: Insufficient documentation

## 2021-11-15 DIAGNOSIS — I10 Essential (primary) hypertension: Secondary | ICD-10-CM | POA: Diagnosis not present

## 2021-11-15 DIAGNOSIS — R42 Dizziness and giddiness: Secondary | ICD-10-CM | POA: Diagnosis not present

## 2021-11-15 LAB — CBC WITH DIFFERENTIAL/PLATELET
Abs Immature Granulocytes: 0.04 10*3/uL (ref 0.00–0.07)
Basophils Absolute: 0 10*3/uL (ref 0.0–0.1)
Basophils Relative: 0 %
Eosinophils Absolute: 0.1 10*3/uL (ref 0.0–0.5)
Eosinophils Relative: 1 %
HCT: 42.1 % (ref 39.0–52.0)
Hemoglobin: 14.7 g/dL (ref 13.0–17.0)
Immature Granulocytes: 0 %
Lymphocytes Relative: 10 %
Lymphs Abs: 1 10*3/uL (ref 0.7–4.0)
MCH: 32 pg (ref 26.0–34.0)
MCHC: 34.9 g/dL (ref 30.0–36.0)
MCV: 91.5 fL (ref 80.0–100.0)
Monocytes Absolute: 0.7 10*3/uL (ref 0.1–1.0)
Monocytes Relative: 7 %
Neutro Abs: 8.4 10*3/uL — ABNORMAL HIGH (ref 1.7–7.7)
Neutrophils Relative %: 82 %
Platelets: 927 10*3/uL (ref 150–400)
RBC: 4.6 MIL/uL (ref 4.22–5.81)
RDW: 14 % (ref 11.5–15.5)
WBC: 10.3 10*3/uL (ref 4.0–10.5)
nRBC: 0 % (ref 0.0–0.2)

## 2021-11-15 LAB — COMPREHENSIVE METABOLIC PANEL
ALT: 24 U/L (ref 0–44)
AST: 22 U/L (ref 15–41)
Albumin: 4.7 g/dL (ref 3.5–5.0)
Alkaline Phosphatase: 61 U/L (ref 38–126)
Anion gap: 8 (ref 5–15)
BUN: 16 mg/dL (ref 8–23)
CO2: 24 mmol/L (ref 22–32)
Calcium: 9.3 mg/dL (ref 8.9–10.3)
Chloride: 104 mmol/L (ref 98–111)
Creatinine, Ser: 1.27 mg/dL — ABNORMAL HIGH (ref 0.61–1.24)
GFR, Estimated: >60 mL/min (ref >60)
Glucose, Bld: 107 mg/dL — ABNORMAL HIGH (ref 70–99)
Potassium: 4.2 mmol/L (ref 3.5–5.1)
Sodium: 136 mmol/L (ref 135–145)
Total Bilirubin: 0.7 mg/dL (ref 0.3–1.2)
Total Protein: 8.5 g/dL — ABNORMAL HIGH (ref 6.5–8.1)

## 2021-11-15 LAB — TROPONIN I (HIGH SENSITIVITY)
Troponin I (High Sensitivity): 7 ng/L (ref <18)
Troponin I (High Sensitivity): 6 ng/L (ref <18)

## 2021-11-15 MED ORDER — ASPIRIN 81 MG PO CHEW
81.0000 mg | CHEWABLE_TABLET | Freq: Every day | ORAL | 0 refills | Status: AC
  Filled 2021-11-15: qty 30, 30d supply, fill #0

## 2021-11-15 NOTE — ED Provider Notes (Signed)
?[facility] ?Provider Note ? ? ?CSN: [id number] ?Arrival date & time: 11/15/21  0010 ? ?  ? ?History ? ?Chief Complaint  ?Patient presents with  ? Dizziness  ? ? ?Mike Mullen is a 61 y.o. male. ? ?The history is provided by the patient.  ?Dizziness ?He has history of hypertension, myeloproliferative disorder and comes in because of an episode of dizziness.  He was sitting down and watching television when he suddenly had an intense dizzy feeling.  He states that felt like things were spinning slightly and that he was off balance.  There was some slight tight feeling in his chest but no palpitations.  He denies vision change and denies nausea and vomiting.  Severe dizziness lasted a few minutes, but when EMS arrived, he was unsteady on his feet and had to hold on while he was walking.  He feels like he is back to his baseline now.  He has not had similar episodes in the past.  He denies headache. ?  ?Home Medications ?Prior to Admission medications   ?Medication Sig Start Date End Date Taking? Authorizing Provider  ?gabapentin (NEURONTIN) 300 MG capsule Take 300 mg by mouth 3 (three) times daily.    [provider]  ?   ? ?Allergies    ?Patient has no known allergies.   ? ?Review of Systems   ?Review of Systems  ?Neurological:  Positive for dizziness.  ?All other systems reviewed and are negative. ? ?Physical Exam ?Updated Vital Signs ?BP (!) 153/96 (BP Location: Right Arm)   Pulse 79   Temp 98 ?F (36.7 ?C) (Oral)   Resp 16   Ht '6\' 4"'$  (1.93 m)   Wt 111.6 kg   SpO2 98%   BMI 29.94 kg/m?  ?Physical Exam ?Vitals and nursing note reviewed.  ?61 year old male, resting comfortably and in no acute distress. Vital signs are significant for elevated blood pressure. Oxygen saturation is 98%, which is normal. ?Head is normocephalic and atraumatic. PERRLA, EOMI. Oropharynx is clear.  There is no nystagmus. ?Neck is nontender and supple without adenopathy or JVD.  There are no  carotid bruits. ?Back is nontender and there is no CVA tenderness. ?Lungs are clear without rales, wheezes, or rhonchi. ?Chest is nontender. ?Heart has regular rate and rhythm without murmur. ?Abdomen is soft, flat, nontender. ?Extremities have no cyanosis or edema, full range of motion is present. ?Skin is warm and dry without rash. ?Neurologic: Mental status is normal, cranial nerves are intact, moves all extremities equally.  Finger-nose testing is normal. ? ?ED Results / Procedures / Treatments   ?Labs ?(all labs ordered are listed, but only abnormal results are displayed) ?Labs Reviewed  ?COMPREHENSIVE METABOLIC PANEL - Abnormal; Notable for the following components:  ?    Result Value  ? Glucose, Bld 107 (*)   ? Creatinine, Ser 1.27 (*)   ? Total Protein 8.5 (*)   ? All other components within normal limits  ?CBC WITH DIFFERENTIAL/PLATELET - Abnormal; Notable for the following components:  ? Platelets 927 (*)   ? Neutro Abs 8.4 (*)   ? All other components within normal limits  ?TROPONIN I (HIGH SENSITIVITY)  ?TROPONIN I (HIGH SENSITIVITY)  ? ? ?EKG ?EKG Interpretation ? ?Date/Time:  Thursday Nov 15 2021 03:15:28 EDT ?Ventricular Rate:  87 ?PR Interval:  191 ?QRS Duration: 88 ?QT Interval:  382 ?QTC Calculation: 460 ?R Axis:   10 ?Text Interpretation: Sinus rhythm Normal ECG When compared with ECG  of 02/16/2016, No significant change was found Confirmed by Delora Fuel ([id number]) on 11/15/2021 3:22:43 AM ? ?Radiology ?No results found. ? ?Procedures ?Procedures  ? ? ?Medications Ordered in ED ?Medications - No data to display ? ?ED Course/ Medical Decision Making/ A&P ?  ?                        ?Medical Decision Making ?Amount and/or Complexity of Data Reviewed ?Labs: ordered. ?Radiology: ordered. ? ? ?Episode of dizziness, possible peripheral vertigo.  However, with some chest tightness, will need to check ECG and troponins to rule out atypical ACS.  We will also check orthostatic vital signs.  Old records are  reviewed, but he has no relevant past visits. ? ?ECG is normal.  Labs are significant for platelet count 927,000.  Old records that showed platelet counts of 500,000-600,000.  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  Troponin is normal x2.  Patient states that he was able to ambulate, but he does not feel like he is back to baseline.  Given his markedly elevated platelet count which could put him at risk for both thrombotic complications and bleeding, will send for MRI to rule out acute stroke.  If negative, he will need to follow-up with the cancer center.  I have discussed the case with Dr. Burr Medico, on-call for hematology-oncology, who states that she will help coordinate office follow-up if MRI is negative.  Case is signed out to Dr. Pearline Cables. ? ?CRITICAL CARE ?Performed by: Delora Fuel ?Total critical care time: 40 minutes ?Critical care time was exclusive of separately billable procedures and treating other patients. ?Critical care was necessary to treat or prevent imminent or life-threatening deterioration. ?Critical care was time spent personally by me on the following activities: development of treatment plan with patient and/or surrogate as well as nursing, discussions with consultants, evaluation of patient's response to treatment, examination of patient, obtaining history from patient or surrogate, ordering and performing treatments and interventions, ordering and review of laboratory studies, ordering and review of radiographic studies, pulse oximetry and re-evaluation of patient's condition. ? ?Final Clinical Impression(s) / ED Diagnoses ?Final diagnoses:  ?Dizziness  ?Thrombocytosis  ? ? ?Rx / DC Orders ?ED Discharge Orders   ? ? None  ? ?  ? ? ?  ?Delora Fuel, MD ?50/35/46 [phone] ? ?

## 2021-11-15 NOTE — Telephone Encounter (Signed)
Scheduled appt per 5/4 referral. Pt is aware of appt date and time. Pt is aware to arrive 15 mins prior to appt time and to bring and updated insurance card. Pt is aware of appt location.   ?

## 2021-11-15 NOTE — ED Notes (Signed)
Patient transported to MRI 

## 2021-11-15 NOTE — ED Notes (Addendum)
Pt ambulatory without assistance. Pt denies any dizziness/weakness with ambulation. ?

## 2021-11-15 NOTE — ED Notes (Signed)
Patient in MRI. Will update vitals once patient returns.  ?

## 2021-11-15 NOTE — ED Notes (Signed)
Unsuccessful IV attempt in L and R AC. 20g. ?

## 2021-11-15 NOTE — ED Triage Notes (Signed)
Pt BIBA from home. Pt c/o suddenly feeling off-balance, like the world is spinning since 2230. Orthostatic vitals were WNL. ? ?No recent head trauma. ?No N/V ? ? ?

## 2021-11-15 NOTE — ED Provider Notes (Signed)
?  Provider Note ?MRN:  [id number]  ?Arrival date & time: 11/15/21    ?ED Course and Medical Decision Making  ?Assumed care from [facility] at shift change. ? ?See not from prior team for complete details, in brief:  ?61 yo male ?Presented with dizziness ?MRI pending for CVA r/o ?Dr Burr Medico heme/onc to f/u  ?D/c w/ asa if MRI neg ? ?MRI reviewed and is negative  ?Pt is asymptomatic, ambulatory w/ steady gait ?Start ASA, f/u with heme/onc ? ?The patient improved significantly and was discharged in stable condition. Detailed discussions were had with the patient regarding current findings, and need for close f/u with PCP or on call doctor. The patient has been instructed to return immediately if the symptoms worsen in any way for re-evaluation. Patient verbalized understanding and is in agreement with current care plan. All questions answered prior to discharge. ? ? ? ? ?Procedures ? ?Final Clinical Impressions(s) / ED Diagnoses  ? ?  ICD-10-CM   ?1. Dizziness  R42   ?  ?2. Thrombocytosis  D75.839   ?  ?  ?ED Discharge Orders   ? ?      Ordered  ?  aspirin 81 MG chewable tablet  Daily       ? 11/15/21 1036  ? ?  ?  ? ?  ?  ? ? ?Discharge Instructions   ? ?  ?It was a pleasure caring for you today in the emergency department. ? ?Please return to the emergency department for any worsening or worrisome symptoms. ? ?Please follow-up with Dr. Burr Medico in 1 week ? ? ? ? ? ? ? ?  ?Jeanell Sparrow, DO ?11/15/21 1042 ? ?

## 2021-11-15 NOTE — Discharge Instructions (Signed)
It was a pleasure caring for you today in the emergency department. ? ?Please return to the emergency department for any worsening or worrisome symptoms. ? ?Please follow-up with Dr. Burr Medico in 1 week ? ?

## 2021-11-16 ENCOUNTER — Telehealth: Payer: Self-pay

## 2021-11-16 ENCOUNTER — Encounter: Payer: Self-pay | Admitting: Hematology

## 2021-11-16 ENCOUNTER — Inpatient Hospital Stay: Payer: 59 | Attending: Hematology | Admitting: Hematology

## 2021-11-16 VITALS — BP 134/92 | HR 94 | Temp 98.4°F | Resp 17 | Wt 248.9 lb

## 2021-11-16 DIAGNOSIS — Z8042 Family history of malignant neoplasm of prostate: Secondary | ICD-10-CM | POA: Diagnosis not present

## 2021-11-16 DIAGNOSIS — D75839 Thrombocytosis, unspecified: Secondary | ICD-10-CM | POA: Insufficient documentation

## 2021-11-16 DIAGNOSIS — Z7982 Long term (current) use of aspirin: Secondary | ICD-10-CM | POA: Insufficient documentation

## 2021-11-16 DIAGNOSIS — Z803 Family history of malignant neoplasm of breast: Secondary | ICD-10-CM | POA: Diagnosis not present

## 2021-11-16 NOTE — Progress Notes (Addendum)
[facility]   Telephone:(336) [phone] Fax:(336) [phone]   Clinic New Consult Note   Patient Care Team: Patient, No Pcp Per (Inactive) as PCP - General (General Practice)  Date of Service:  11/16/2021   CHIEF COMPLAINTS/PURPOSE OF CONSULTATION:  Thrombocytosis  REFERRING PHYSICIAN:  Hospitalist  ASSESSMENT & PLAN:  Mike Mullen is a 61 y.o. male with  1. 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 -reports he was previously evaluated in 2018 while living in [place], had a bone marrow biopsy around that time.  He was put on baby aspirin, no cytoreduction therapy. we will obtain his prior records. -presented to ED yesterday, 11/15/21, with dizziness. He was found to have platelet count of 927k, the rest of CBC was normal. Brain MRI was negative  -I discussed genetic blood testing, including JAK2 testing, I suspect he had that done in [place] -I discussed the etiology of his thrombocytosis.  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 -We discussed his risk of thrombosis.  He is above age 70, if he has JAK2 mutation, his risk of thrombosis is high.  I would recommend cytoreduction therapy with Hydrea.  I discussed the other options such as anagrelide and interferon.  -his physical exam was unremarkable. I will order abdomen US to evaluate splenomegaly. -We also discussed risk of bleeding due to his very high platelet count (close to 1 minute).  He has no any signs of bleeding at this point, I will hold on checking von Willebrand panel for now.  PLAN:  -abd [place] to be done soon -We will obtain his medical records from his hematologist in [place] -Continue aspirin 81 mg daily -We will likely  order Hydrea to be started next week after reviewing his outside medical records -lab every 2 weeks x3 and titrate his Hydrea dose, with goal platelet less than 450 K -f/u in 6 weeks   HISTORY OF PRESENTING ILLNESS:  Mike Mullen 61 y.o. male is a here because of thrombocytosis. The patient was referred by [facility] hospitalist. The patient presents to the clinic today alone.   He reports he previously received care in [place], [place]. He notes he was told about his elevated platelets and was advised to start baby aspirin, but he adds he has not taken it lately. He reports he has previously had a bone marrow biopsy, around 2018.   He has a PMHx of.... -s/p hernia repair   Socially... -he is single with no children -he reports prostate cancer in his father (in 35's) and breast cancer in two of his sisters (around ages 57 and 47) -he drinks a beer once a week at most.  REVIEW OF SYSTEMS:    Constitutional: Denies fevers, chills or abnormal night sweats Eyes: Denies blurriness of vision, double vision or watery eyes Ears, nose, mouth, throat, and face: Denies mucositis or sore throat Respiratory: Denies cough, dyspnea or wheezes Cardiovascular: Denies palpitation, chest discomfort or lower extremity swelling Gastrointestinal:  Denies nausea, heartburn or change in bowel habits Skin: Denies abnormal skin rashes Lymphatics: Denies new lymphadenopathy or easy bruising Neurological:Denies numbness, tingling or new weaknesses Behavioral/Psych: Mood is stable, no new changes  All other systems were reviewed with the patient and are negative.   MEDICAL HISTORY:  Past Medical History:  Diagnosis Date  Hypertension    Inguinal hernia    left    SURGICAL HISTORY: Past Surgical History:  Procedure Laterality Date   HERNIA REPAIR     INGUINAL HERNIA REPAIR Left 02/19/2016   Procedure: OPEN LEFT INGUINAL HERNIA REPAIR;  Surgeon: Jimmye Norman, MD;  Location: [facility];  Service:  General;  Laterality: Left;   INSERTION OF MESH Left 02/19/2016   Procedure: INSERTION OF MESH;  Surgeon: Jimmye Norman, MD;  Location: [facility];  Service: General;  Laterality: Left;    SOCIAL HISTORY: Social History   Socioeconomic History   Marital status: Single    Spouse name: Not on file   Number of children: 0   Years of education: Not on file   Highest education level: Not on file  Occupational History   Not on file  Tobacco Use   Smoking status: Never   Smokeless tobacco: Never  Substance and Sexual Activity   Alcohol use: Yes    Alcohol/week: 1.0 standard drink    Types: 1 Cans of beer per week    Comment: social   Drug use: No   Sexual activity: Not on file  Other Topics Concern   Not on file  Social History Narrative   Not on file   Social Determinants of Health   Financial Resource Strain: Not on file  Food Insecurity: Not on file  Transportation Needs: Not on file  Physical Activity: Not on file  Stress: Not on file  Social Connections: Not on file  Intimate Partner Violence: Not on file    FAMILY HISTORY: Family History  Problem Relation Age of Onset   Cancer Father 27       prostate cancer   Cancer Sister        breast cancer   Cancer Sister        breast cancer    ALLERGIES:  has No Known Allergies.  MEDICATIONS:  Current Outpatient Medications  Medication Sig Dispense Refill   aspirin 81 MG chewable tablet Chew 1 tablet by mouth daily. 30 tablet 0   No current facility-administered medications for this visit.    PHYSICAL EXAMINATION: ECOG PERFORMANCE STATUS: 0 - Asymptomatic  Vitals:   11/16/21 1224  BP: (!) 134/92  Pulse: 94  Resp: 17  Temp: 98.4 F (36.9 C)  SpO2: 99%   Filed Weights   11/16/21 1224  Weight: 248 lb 14.4 oz (112.9 kg)    GENERAL:alert, no distress and comfortable SKIN: skin color, texture, turgor are normal, no rashes or significant lesions EYES: normal, Conjunctiva are pink and  non-injected, sclera clear  LUNGS: clear to auscultation and percussion with normal breathing effort HEART: regular rate & rhythm and no murmurs and no lower extremity edema ABDOMEN:abdomen soft, non-tender and normal bowel sounds Musculoskeletal:no cyanosis of digits and no clubbing  NEURO: alert & oriented x 3 with fluent speech, no focal motor/sensory deficits  LABORATORY DATA:  I have reviewed the data as listed    Latest Ref Rng & Units 11/15/2021    3:18 AM 02/16/2016   10:35 AM 03/09/2014    2:20 PM  CBC  WBC 4.0 - 10.5 K/uL 10.3   5.5   9.8    Hemoglobin 13.0 - 17.0 g/dL 16.1   09.6   04.5    Hematocrit 39.0 - 52.0 % 42.1   43.8   38.5    Platelets 150 - 400 K/uL 927  C 654   524  C Corrected result       Latest Ref Rng & Units 11/15/2021    3:18 AM 02/16/2016   10:35 AM 03/09/2014    2:20 PM  CMP  Glucose 70 - 99 mg/dL 161   72   096    BUN 8 - 23 mg/dL 16   11   9     Creatinine 0.61 - 1.24 mg/dL 0.45   4.09   8.11    Sodium 135 - 145 mmol/L 136   138   143    Potassium 3.5 - 5.1 mmol/L 4.2   4.0   3.6    Chloride 98 - 111 mmol/L 104   104   105    CO2 22 - 32 mmol/L 24   27   26     Calcium 8.9 - 10.3 mg/dL 9.3   9.1   9.4    Total Protein 6.5 - 8.1 g/dL 8.5    7.3    Total Bilirubin 0.3 - 1.2 mg/dL 0.7    0.5    Alkaline Phos 38 - 126 U/L 61    65    AST 15 - 41 U/L 22    32    ALT 0 - 44 U/L 24    46       RADIOGRAPHIC STUDIES: I have personally reviewed the radiological images as listed and agreed with the findings in the report. MR BRAIN WO CONTRAST  Result Date: 11/15/2021 CLINICAL DATA:  Neuro deficit, acute, stroke suspected EXAM: MRI HEAD WITHOUT CONTRAST TECHNIQUE: Multiplanar, multiecho pulse sequences of the brain and surrounding structures were obtained without intravenous contrast. COMPARISON:  None Available. FINDINGS: Brain: No acute infarction, hemorrhage, hydrocephalus, extra-axial collection or mass lesion. Cerebral atrophy. Vascular: Major  arterial flow voids are maintained at the skull base. Skull and upper cervical spine: Normal marrow signal. Sinuses/Orbits: Clear sinuses.  No acute orbital findings. Other: No mastoid effusions. IMPRESSION: 1. No evidence of acute intracranial abnormality. 2.  Cerebral atrophy (ICD10-G31.9). Electronically Signed   By: Feliberto Harts M.D.   On: 11/15/2021 09:14     No orders of the defined types were placed in this encounter.   All questions were answered. The patient knows to call the clinic with any problems, questions or concerns. The total time spent in the appointment was 45 minutes.     Malachy Mood, MD 11/16/2021 12:50 PM  I, Mickie Bail, am acting as scribe for Malachy Mood, MD.   I have reviewed the above documentation for accuracy and completeness, and I agree with the above.

## 2021-11-16 NOTE — Telephone Encounter (Signed)
Faxed medical release form to Dr. Mallie Mussel B. Fox [facility] [phone]) requesting pt's last office visit note, recent labs, and BM results.  Requested if information could be faxed today if possible.  Fax confirmation received.  Also, spoke with receptionist regarding fax confirmation.  Receptionist stated pt was last seen in 2021 but she will fax Dr. Burr Medico what they have on file. ?

## 2021-11-17 ENCOUNTER — Encounter: Payer: Self-pay | Admitting: Hematology

## 2021-11-21 ENCOUNTER — Other Ambulatory Visit: Payer: Self-pay

## 2021-11-21 ENCOUNTER — Telehealth: Payer: Self-pay | Admitting: Hematology

## 2021-11-21 NOTE — Telephone Encounter (Signed)
Per 5/5 los called and spoke to pt about 3 lab and f/u visit.  Pt confirmed appointment  ?

## 2021-11-22 ENCOUNTER — Telehealth: Payer: Self-pay

## 2021-11-22 NOTE — Telephone Encounter (Signed)
LVM on pt's preferred phone regarding status of Hydrea prescription.  Informed pt that Dr. Ernestina Penna office has contact Dr. Mallie Mussel Fox's off at [facility] regarding his medical records (which Dr. Burr Medico needs as baseline prior to prescribing Hydra).  2 request were sent to Dr. Cherlyn Cushing office and a response of that they do not have medical records on this patient and do not know what doctor this pt have seen.  Sent a MyChart message to pt on 11/21/2021 asking the pt to please contact Dr. Cherlyn Cushing office to request his medical records to be faxed to [phone].  Also, left this same information on the pt's voicemail today.  Pt's log-in on MyChart was 11/21/2021.  Contacted Dr. Cherlyn Cushing office again today to request medical records on this pt and to have Dr. Hassell Done contact Dr. Ernestina Penna cellphone.  LVM.  Awaiting response from Dr. Cherlyn Cushing office.  If no response, Dr. Burr Medico would like for the pt to come in for labs can be drawn prior to prescribing Hydrea.  Also, faxed a 3rd medical records request to Dr. Mallie Mussel Fox's office.  Fax confirmation received.  ?

## 2021-11-23 ENCOUNTER — Other Ambulatory Visit: Payer: Self-pay | Admitting: Hematology

## 2021-11-23 ENCOUNTER — Other Ambulatory Visit: Payer: Self-pay

## 2021-11-23 DIAGNOSIS — D75839 Thrombocytosis, unspecified: Secondary | ICD-10-CM

## 2021-11-26 ENCOUNTER — Other Ambulatory Visit: Payer: Self-pay | Admitting: Hematology

## 2021-11-26 ENCOUNTER — Ambulatory Visit (HOSPITAL_COMMUNITY)
Admission: RE | Admit: 2021-11-26 | Discharge: 2021-11-26 | Disposition: A | Discharge status: Home / Self Care | Payer: 59 | Source: Ambulatory Visit | Attending: Hematology | Admitting: Hematology

## 2021-11-26 ENCOUNTER — Inpatient Hospital Stay: Payer: 59

## 2021-11-26 ENCOUNTER — Other Ambulatory Visit (HOSPITAL_COMMUNITY): Payer: Self-pay

## 2021-11-26 ENCOUNTER — Other Ambulatory Visit: Payer: Self-pay

## 2021-11-26 DIAGNOSIS — D75839 Thrombocytosis, unspecified: Secondary | ICD-10-CM

## 2021-11-26 LAB — CBC WITH DIFFERENTIAL/PLATELET
Abs Immature Granulocytes: 0.03 10*3/uL (ref 0.00–0.07)
Basophils Absolute: 0 10*3/uL (ref 0.0–0.1)
Basophils Relative: 1 %
Eosinophils Absolute: 0.2 10*3/uL (ref 0.0–0.5)
Eosinophils Relative: 3 %
HCT: 40.6 % (ref 39.0–52.0)
Hemoglobin: 14 g/dL (ref 13.0–17.0)
Immature Granulocytes: 1 %
Lymphocytes Relative: 18 %
Lymphs Abs: 1 10*3/uL (ref 0.7–4.0)
MCH: 31.1 pg (ref 26.0–34.0)
MCHC: 34.5 g/dL (ref 30.0–36.0)
MCV: 90.2 fL (ref 80.0–100.0)
Monocytes Absolute: 0.5 10*3/uL (ref 0.1–1.0)
Monocytes Relative: 9 %
Neutro Abs: 4.1 10*3/uL (ref 1.7–7.7)
Neutrophils Relative %: 68 %
Platelets: 1168 10*3/uL (ref 150–400)
RBC: 4.5 MIL/uL (ref 4.22–5.81)
RDW: 13.7 % (ref 11.5–15.5)
WBC: 5.9 10*3/uL (ref 4.0–10.5)
nRBC: 0 % (ref 0.0–0.2)

## 2021-11-26 LAB — COMPREHENSIVE METABOLIC PANEL
ALT: 18 U/L (ref 0–44)
AST: 19 U/L (ref 15–41)
Albumin: 4.5 g/dL (ref 3.5–5.0)
Alkaline Phosphatase: 52 U/L (ref 38–126)
Anion gap: 7 (ref 5–15)
BUN: 17 mg/dL (ref 8–23)
CO2: 29 mmol/L (ref 22–32)
Calcium: 9.2 mg/dL (ref 8.9–10.3)
Chloride: 102 mmol/L (ref 98–111)
Creatinine, Ser: 1.26 mg/dL — ABNORMAL HIGH (ref 0.61–1.24)
GFR, Estimated: >60 mL/min (ref >60)
Glucose, Bld: 108 mg/dL — ABNORMAL HIGH (ref 70–99)
Potassium: 3.7 mmol/L (ref 3.5–5.1)
Sodium: 138 mmol/L (ref 135–145)
Total Bilirubin: 0.4 mg/dL (ref 0.3–1.2)
Total Protein: 7.6 g/dL (ref 6.5–8.1)

## 2021-11-26 MED ORDER — HYDROXYUREA 500 MG PO CAPS
500.0000 mg | ORAL_CAPSULE | Freq: Every day | ORAL | 0 refills | Status: DC
  Filled 2021-11-26: qty 30, 30d supply, fill #0
  Filled 2021-12-18 – 2021-12-19 (×2): qty 30, 30d supply, fill #1

## 2021-11-26 NOTE — Progress Notes (Unsigned)
yd

## 2021-12-03 LAB — JAK2 (INCLUDING V617F AND EXON 12), MPL,& CALR W/RFL MPN PANEL (NGS)

## 2021-12-06 NOTE — Progress Notes (Incomplete)
Mike Mullen   Telephone:([phone] Fax:(336) [phone]   Clinic Follow up Note   Patient Care Team: Default, Provider, MD as PCP - General  Date of Service:  12/06/2021  CHIEF COMPLAINT: f/u of thrombocytosis  CURRENT THERAPY:  Hydrea***  ASSESSMENT & PLAN:  Mike Mullen is a 61 y.o. male with   1. 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 -reports he was previously evaluated in 2018 while living in [place], had a bone marrow biopsy around that time.  He was put on baby aspirin, no cytoreduction therapy. We will obtain his prior records. -presented to ED on 11/15/21 with dizziness. He was found to have platelet count of 927k, the rest of CBC was normal. Brain MRI was negative  -abdomen US on 11/26/21 was negative. 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 -we were unable to obtain his prior BMB results***   {-I discussed genetic blood testing, including JAK2 testing, I suspect he had that done in [place] -I discussed the etiology of his thrombocytosis.  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 -We discussed his risk of thrombosis.  He is above age 73, if he has JAK2 mutation, his risk of thrombosis is high.  I would recommend cytoreduction therapy with Hydrea.  I discussed the other options such as anagrelide and interferon.  -his physical exam was unremarkable. I will order abdomen US to evaluate splenomegaly. -We also discussed risk of bleeding due to his very high platelet count (close to 1 minute).  He has no any signs of bleeding at this point, I will hold on checking von Willebrand panel for now.}    PLAN:  ***   No problem-specific Assessment & Plan notes found for  this encounter.   INTERVAL HISTORY: *** Mike Mullen is here for a follow up of thrombocytosis. He was last seen by me on 11/16/21 in consultation. He presents to the clinic {alone/accompanied by}.   All other systems were reviewed with the patient and are negative.  MEDICAL HISTORY:  Past Medical History:  Diagnosis Date   Hypertension    Inguinal hernia    left    SURGICAL HISTORY: Past Surgical History:  Procedure Laterality Date   HERNIA REPAIR     INGUINAL HERNIA REPAIR Left 02/19/2016   Procedure: OPEN LEFT INGUINAL HERNIA REPAIR;  Surgeon: Judeth Horn, MD;  Location: [facility];  Service: General;  Laterality: Left;   INSERTION OF MESH Left 02/19/2016   Procedure: INSERTION OF MESH;  Surgeon: Judeth Horn, MD;  Location: [facility];  Service: General;  Laterality: Left;    I have reviewed the social history and family history with the patient and they are unchanged from previous note.  ALLERGIES:  has No Known Allergies.  MEDICATIONS:  Current Outpatient Medications  Medication Sig Dispense Refill   aspirin 81 MG chewable tablet Chew 1 tablet by mouth daily. 30 tablet 0   hydroxyurea (HYDREA) 500 MG capsule Take 1 capsule by mouth daily. May take with food to minimize GI side effects. Dose may be adjusted based on lab results 60 capsule 0   No current facility-administered medications for this visit.    PHYSICAL EXAMINATION: ECOG PERFORMANCE STATUS: {CHL ONC ECOG PS:[id number]}  There were no vitals filed for this  visit. Wt Readings from Last 3 Encounters:  11/16/21 248 lb 14.4 oz (112.9 kg)  11/15/21 246 lb (111.6 kg)  02/19/16 245 lb (111.1 kg)    *** GENERAL:alert, no distress and comfortable SKIN: skin color, texture, turgor are normal, no rashes or significant lesions EYES: normal, Conjunctiva are pink and non-injected, sclera clear {OROPHARYNX:no exudate, no erythema and lips, buccal mucosa, and tongue normal}  NECK: supple,  thyroid normal size, non-tender, without nodularity LYMPH:  no palpable lymphadenopathy in the cervical, axillary {or inguinal} LUNGS: clear to auscultation and percussion with normal breathing effort HEART: regular rate & rhythm and no murmurs and no lower extremity edema ABDOMEN:abdomen soft, non-tender and normal bowel sounds Musculoskeletal:no cyanosis of digits and no clubbing  NEURO: alert & oriented x 3 with fluent speech, no focal motor/sensory deficits  LABORATORY DATA:  I have reviewed the data as listed    Latest Ref Rng & Units 11/26/2021    7:53 AM 11/15/2021    3:18 AM 02/16/2016   10:35 AM  CBC  WBC 4.0 - 10.5 K/uL 5.9   10.3   5.5    Hemoglobin 13.0 - 17.0 g/dL 14.0   14.7   14.5    Hematocrit 39.0 - 52.0 % 40.6   42.1   43.8    Platelets 150 - 400 K/uL 1,168   927  C 654      C Corrected result        Latest Ref Rng & Units 11/26/2021    7:53 AM 11/15/2021    3:18 AM 02/16/2016   10:35 AM  CMP  Glucose 70 - 99 mg/dL 108   107   72    BUN 8 - 23 mg/dL 17   16   11     Creatinine 0.61 - 1.24 mg/dL 1.26   1.27   0.90    Sodium 135 - 145 mmol/L 138   136   138    Potassium 3.5 - 5.1 mmol/L 3.7   4.2   4.0    Chloride 98 - 111 mmol/L 102   104   104    CO2 22 - 32 mmol/L 29   24   27     Calcium 8.9 - 10.3 mg/dL 9.2   9.3   9.1    Total Protein 6.5 - 8.1 g/dL 7.6   8.5     Total Bilirubin 0.3 - 1.2 mg/dL 0.4   0.7     Alkaline Phos 38 - 126 U/L 52   61     AST 15 - 41 U/L 19   22     ALT 0 - 44 U/L 18   24         RADIOGRAPHIC STUDIES: I have personally reviewed the radiological images as listed and agreed with the findings in the report. No results found.    No orders of the defined types were placed in this encounter.  All questions were answered. The patient knows to call the clinic with any problems, questions or concerns. No barriers to learning was detected. The total time spent in the appointment was {CHL ONC TIME VISIT - GLOVF:[id number]}.      Aurea Graff 12/06/2021   I, Wilburn Mylar, am acting as scribe for Truitt Merle, MD.   {Add scribe attestation statement}

## 2021-12-07 ENCOUNTER — Inpatient Hospital Stay: Payer: 59

## 2021-12-07 ENCOUNTER — Inpatient Hospital Stay: Payer: 59 | Admitting: Hematology

## 2021-12-12 ENCOUNTER — Inpatient Hospital Stay: Payer: 59

## 2021-12-12 ENCOUNTER — Other Ambulatory Visit: Payer: Self-pay

## 2021-12-12 ENCOUNTER — Telehealth: Payer: Self-pay

## 2021-12-12 DIAGNOSIS — D75839 Thrombocytosis, unspecified: Secondary | ICD-10-CM

## 2021-12-12 LAB — COMPREHENSIVE METABOLIC PANEL
ALT: 17 U/L (ref 0–44)
AST: 18 U/L (ref 15–41)
Albumin: 4.5 g/dL (ref 3.5–5.0)
Alkaline Phosphatase: 54 U/L (ref 38–126)
Anion gap: 6 (ref 5–15)
BUN: 19 mg/dL (ref 8–23)
CO2: 28 mmol/L (ref 22–32)
Calcium: 9.3 mg/dL (ref 8.9–10.3)
Chloride: 104 mmol/L (ref 98–111)
Creatinine, Ser: 1.22 mg/dL (ref 0.61–1.24)
GFR, Estimated: >60 mL/min (ref >60)
Glucose, Bld: 103 mg/dL — ABNORMAL HIGH (ref 70–99)
Potassium: 4 mmol/L (ref 3.5–5.1)
Sodium: 138 mmol/L (ref 135–145)
Total Bilirubin: 0.6 mg/dL (ref 0.3–1.2)
Total Protein: 7.6 g/dL (ref 6.5–8.1)

## 2021-12-12 LAB — CBC WITH DIFFERENTIAL/PLATELET
Abs Immature Granulocytes: 0.02 10*3/uL (ref 0.00–0.07)
Basophils Absolute: 0 10*3/uL (ref 0.0–0.1)
Basophils Relative: 1 %
Eosinophils Absolute: 0.1 10*3/uL (ref 0.0–0.5)
Eosinophils Relative: 2 %
HCT: 40.5 % (ref 39.0–52.0)
Hemoglobin: 14 g/dL (ref 13.0–17.0)
Immature Granulocytes: 0 %
Lymphocytes Relative: 19 %
Lymphs Abs: 1.1 10*3/uL (ref 0.7–4.0)
MCH: 31.3 pg (ref 26.0–34.0)
MCHC: 34.6 g/dL (ref 30.0–36.0)
MCV: 90.6 fL (ref 80.0–100.0)
Monocytes Absolute: 0.5 10*3/uL (ref 0.1–1.0)
Monocytes Relative: 9 %
Neutro Abs: 4 10*3/uL (ref 1.7–7.7)
Neutrophils Relative %: 69 %
Platelets: 1065 10*3/uL (ref 150–400)
RBC: 4.47 MIL/uL (ref 4.22–5.81)
RDW: 14.2 % (ref 11.5–15.5)
WBC: 5.8 10*3/uL (ref 4.0–10.5)
nRBC: 0 % (ref 0.0–0.2)

## 2021-12-12 NOTE — Telephone Encounter (Signed)
Mike Mullen in lab reported a critical lab value of plts 1,065 today.  Notified Mike Rue, NP since Dr. Burr Medico and Dr. Alvy Bimler are out of the office today.

## 2021-12-18 ENCOUNTER — Other Ambulatory Visit (HOSPITAL_COMMUNITY): Payer: Self-pay

## 2021-12-19 ENCOUNTER — Telehealth: Payer: Self-pay

## 2021-12-19 ENCOUNTER — Other Ambulatory Visit (HOSPITAL_COMMUNITY): Payer: Self-pay

## 2021-12-19 ENCOUNTER — Other Ambulatory Visit: Payer: Self-pay

## 2021-12-19 ENCOUNTER — Inpatient Hospital Stay: Payer: 59 | Attending: Hematology

## 2021-12-19 DIAGNOSIS — Z7982 Long term (current) use of aspirin: Secondary | ICD-10-CM | POA: Diagnosis not present

## 2021-12-19 DIAGNOSIS — Z803 Family history of malignant neoplasm of breast: Secondary | ICD-10-CM | POA: Insufficient documentation

## 2021-12-19 DIAGNOSIS — Z8042 Family history of malignant neoplasm of prostate: Secondary | ICD-10-CM | POA: Insufficient documentation

## 2021-12-19 DIAGNOSIS — D75839 Thrombocytosis, unspecified: Secondary | ICD-10-CM | POA: Insufficient documentation

## 2021-12-19 LAB — CBC WITH DIFFERENTIAL/PLATELET
Abs Immature Granulocytes: 0.04 10*3/uL (ref 0.00–0.07)
Basophils Absolute: 0 10*3/uL (ref 0.0–0.1)
Basophils Relative: 1 %
Eosinophils Absolute: 0.1 10*3/uL (ref 0.0–0.5)
Eosinophils Relative: 2 %
HCT: 40.8 % (ref 39.0–52.0)
Hemoglobin: 14.1 g/dL (ref 13.0–17.0)
Immature Granulocytes: 1 %
Lymphocytes Relative: 21 %
Lymphs Abs: 1.2 10*3/uL (ref 0.7–4.0)
MCH: 31.6 pg (ref 26.0–34.0)
MCHC: 34.6 g/dL (ref 30.0–36.0)
MCV: 91.5 fL (ref 80.0–100.0)
Monocytes Absolute: 0.6 10*3/uL (ref 0.1–1.0)
Monocytes Relative: 10 %
Neutro Abs: 3.9 10*3/uL (ref 1.7–7.7)
Neutrophils Relative %: 65 %
Platelets: 1031 10*3/uL (ref 150–400)
RBC: 4.46 MIL/uL (ref 4.22–5.81)
RDW: 14.7 % (ref 11.5–15.5)
WBC: 5.9 10*3/uL (ref 4.0–10.5)
nRBC: 0 % (ref 0.0–0.2)

## 2021-12-19 NOTE — Telephone Encounter (Signed)
Lab called with a critical lab value for pt Plts 1,031 today.  Pt is taking hydrea.  Notified Cira Rue, NP of the critical lab value.

## 2021-12-24 ENCOUNTER — Other Ambulatory Visit: Payer: 59

## 2021-12-26 ENCOUNTER — Other Ambulatory Visit: Payer: Self-pay

## 2021-12-26 ENCOUNTER — Other Ambulatory Visit (HOSPITAL_COMMUNITY): Payer: Self-pay

## 2021-12-26 ENCOUNTER — Inpatient Hospital Stay: Payer: 59

## 2021-12-26 ENCOUNTER — Telehealth: Payer: Self-pay

## 2021-12-26 DIAGNOSIS — D75839 Thrombocytosis, unspecified: Secondary | ICD-10-CM

## 2021-12-26 LAB — CBC WITH DIFFERENTIAL/PLATELET
Abs Immature Granulocytes: 0.01 10*3/uL (ref 0.00–0.07)
Basophils Absolute: 0 10*3/uL (ref 0.0–0.1)
Basophils Relative: 0 %
Eosinophils Absolute: 0.1 10*3/uL (ref 0.0–0.5)
Eosinophils Relative: 2 %
HCT: 41.3 % (ref 39.0–52.0)
Hemoglobin: 14.1 g/dL (ref 13.0–17.0)
Immature Granulocytes: 0 %
Lymphocytes Relative: 17 %
Lymphs Abs: 0.9 10*3/uL (ref 0.7–4.0)
MCH: 31.6 pg (ref 26.0–34.0)
MCHC: 34.1 g/dL (ref 30.0–36.0)
MCV: 92.6 fL (ref 80.0–100.0)
Monocytes Absolute: 0.5 10*3/uL (ref 0.1–1.0)
Monocytes Relative: 10 %
Neutro Abs: 3.9 10*3/uL (ref 1.7–7.7)
Neutrophils Relative %: 71 %
Platelets: 946 10*3/uL (ref 150–400)
RBC: 4.46 MIL/uL (ref 4.22–5.81)
RDW: 14.8 % (ref 11.5–15.5)
WBC: 5.6 10*3/uL (ref 4.0–10.5)
nRBC: 0 % (ref 0.0–0.2)

## 2021-12-26 MED ORDER — HYDROXYUREA 500 MG PO CAPS
1000.0000 mg | ORAL_CAPSULE | Freq: Every day | ORAL | 1 refills | Status: DC
  Filled 2021-12-26: qty 54, 27d supply, fill #0
  Filled 2021-12-31: qty 6, 3d supply, fill #0

## 2021-12-26 NOTE — Telephone Encounter (Signed)
Mike Mullen in lab reported pt has a critical plt of 946 today.  Pt was 1,046 last week and currently taking hydrea.  Notified Dr. Burr Medico of pt's plts this week.

## 2021-12-27 ENCOUNTER — Telehealth: Payer: Self-pay

## 2021-12-27 NOTE — Telephone Encounter (Signed)
Spoke with pt via telephone regarding his Hydrea prescription.  Pt stated "Yes" he is taking 1000 mg daily as directed via our last telephone conversation.  Refill prescription with new dose was sent to [facility].

## 2021-12-28 ENCOUNTER — Other Ambulatory Visit (HOSPITAL_COMMUNITY): Payer: Self-pay

## 2021-12-31 ENCOUNTER — Other Ambulatory Visit (HOSPITAL_COMMUNITY): Payer: Self-pay

## 2022-01-02 ENCOUNTER — Ambulatory Visit: Payer: 59 | Admitting: Hematology

## 2022-01-03 ENCOUNTER — Other Ambulatory Visit (HOSPITAL_COMMUNITY): Payer: Self-pay

## 2022-01-09 ENCOUNTER — Inpatient Hospital Stay: Payer: 59

## 2022-01-09 ENCOUNTER — Other Ambulatory Visit: Payer: Self-pay

## 2022-01-09 DIAGNOSIS — D75839 Thrombocytosis, unspecified: Secondary | ICD-10-CM

## 2022-01-09 LAB — CBC WITH DIFFERENTIAL/PLATELET
Abs Immature Granulocytes: 0.01 10*3/uL (ref 0.00–0.07)
Basophils Absolute: 0 10*3/uL (ref 0.0–0.1)
Basophils Relative: 0 %
Eosinophils Absolute: 0.1 10*3/uL (ref 0.0–0.5)
Eosinophils Relative: 2 %
HCT: 41.7 % (ref 39.0–52.0)
Hemoglobin: 14.2 g/dL (ref 13.0–17.0)
Immature Granulocytes: 0 %
Lymphocytes Relative: 19 %
Lymphs Abs: 0.9 10*3/uL (ref 0.7–4.0)
MCH: 31.8 pg (ref 26.0–34.0)
MCHC: 34.1 g/dL (ref 30.0–36.0)
MCV: 93.5 fL (ref 80.0–100.0)
Monocytes Absolute: 0.4 10*3/uL (ref 0.1–1.0)
Monocytes Relative: 8 %
Neutro Abs: 3.4 10*3/uL (ref 1.7–7.7)
Neutrophils Relative %: 71 %
Platelets: 872 10*3/uL — ABNORMAL HIGH (ref 150–400)
RBC: 4.46 MIL/uL (ref 4.22–5.81)
RDW: 15.4 % (ref 11.5–15.5)
WBC: 4.9 10*3/uL (ref 4.0–10.5)
nRBC: 0 % (ref 0.0–0.2)

## 2022-01-17 ENCOUNTER — Other Ambulatory Visit (HOSPITAL_COMMUNITY): Payer: Self-pay

## 2022-01-17 ENCOUNTER — Other Ambulatory Visit: Payer: Self-pay

## 2022-01-17 ENCOUNTER — Encounter: Payer: Self-pay | Admitting: Hematology

## 2022-01-17 ENCOUNTER — Inpatient Hospital Stay: Payer: 59 | Attending: Hematology | Admitting: Hematology

## 2022-01-17 DIAGNOSIS — D473 Essential (hemorrhagic) thrombocythemia: Secondary | ICD-10-CM | POA: Diagnosis not present

## 2022-01-17 DIAGNOSIS — Z79899 Other long term (current) drug therapy: Secondary | ICD-10-CM | POA: Insufficient documentation

## 2022-01-17 DIAGNOSIS — D75839 Thrombocytosis, unspecified: Secondary | ICD-10-CM | POA: Insufficient documentation

## 2022-01-17 MED ORDER — HYDROXYUREA 500 MG PO CAPS
ORAL_CAPSULE | ORAL | 1 refills | Status: DC
  Filled 2022-01-17: qty 90, fill #0
  Filled 2022-02-01: qty 90, 30d supply, fill #0

## 2022-01-17 NOTE — Progress Notes (Signed)
Mike Mullen   Telephone:([phone] Fax:(336) [phone]   Clinic Follow up Note   Patient Care Team: Default, Provider, MD as PCP - General  Date of Service:  01/17/2022  CHIEF COMPLAINT: f/u of thrombocythemia  CURRENT THERAPY:  Hydrea, started 11/26/21, to increase to 1527m daily  ASSESSMENT & PLAN:  Mike Dusenberyis a 61y.o. male with   1. Essential Thrombocythemia -h/o isolated thrombocytosis since at least 2015, never had venous or arterial thrombosis. -reports [place]. On review of his limited provided records (from 08/2018 - 10/2021), he was prescribed Hydrea 5053mon 12/21/19, but it's not clear if he ever took it. We do not have the results of his BMB; he will try to find these in his records. -presented to ED on 11/15/21 with dizziness. He was found to have platelet count of 927k, the rest of CBC was normal. Brain MRI was negative. -abdomen USKorean 11/26/21 was negative. 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 -last platelet count was 872k on 01/09/22, which is improved but slowly. I recommend increasing to 1500m67mily (1000mg68m+ 500mg 18m-plan to repeat labs in 2 and 4 weeks    PLAN:  -increase Hydrea to 1500mg d44m (1000mg AM57m00mg PM)58mb in 2 and 4 weeks -f/u in 4 weeks   No problem-specific Assessment & Plan notes found for this encounter.   INTERVAL HISTORY:  Mike GePryor Guettlerfor a follow up of thrombocytosis. He was last seen by me on 11/16/21 in consultation. He presents to the clinic alone. He reports he is doing well overall, no new concerns or bleeding.   All other systems were reviewed with the patient and are negative.  MEDICAL HISTORY:  Past Medical History:  Diagnosis Date   Hypertension    Inguinal hernia    left     SURGICAL HISTORY: Past Surgical History:  Procedure Laterality Date   HERNIA REPAIR     INGUINAL HERNIA REPAIR Left 02/19/2016   Procedure: OPEN LEFT INGUINAL HERNIA REPAIR;  Surgeon: James WyaJudeth Horncation: [facility];  Laterality: Left;   INSERTION OF MESH Left 02/19/2016   Procedure: INSERTION OF MESH;  Surgeon: James WyaJudeth Horncation: [facility]: General;  Laterality: Left;    I have reviewed the social history and family history with the patient and they are unchanged from previous note.  ALLERGIES:  has No Known Allergies.  MEDICATIONS:  Current Outpatient Medications  Medication Sig Dispense Refill   hydroxyurea (HYDREA) 500 MG capsule Take 2 caps in morning and 1 cap in evening 90 capsule 1   No current facility-administered medications for this visit.    PHYSICAL EXAMINATION: ECOG PERFORMANCE STATUS: 0 - Asymptomatic  Vitals:   01/17/22 1318  BP: 128/78  Pulse: 98  Resp: 18  Temp: 98.3 F (36.8 C)  SpO2: 99%   Wt Readings from Last 3 Encounters:  01/17/22 248 lb 4.8 oz (112.6 kg)  11/16/21 248 lb 14.4 oz (112.9 kg)  11/15/21 246 lb (111.6 kg)     GENERAL:alert, no distress and comfortable SKIN: skin color normal, no rashes or significant lesions EYES: normal, Conjunctiva are pink and non-injected, sclera clear  NEURO: alert & oriented x 3 with  fluent speech  LABORATORY DATA:  I have reviewed the data as listed    Latest Ref Rng & Units 01/09/2022    9:18 AM 12/26/2021    9:14 AM 12/19/2021    8:22 AM  CBC  WBC 4.0 - 10.5 Mike/uL 4.9  5.6  5.9   Hemoglobin 13.0 - 17.0 g/dL 14.2  14.1  14.1   Hematocrit 39.0 - 52.0 % 41.7  41.3  40.8   Platelets 150 - 400 Mike/uL 872  946  1,031         Latest Ref Rng & Units 12/12/2021    7:51 AM 11/26/2021    7:53 AM 11/15/2021    3:18 AM  CMP  Glucose 70 - 99 mg/dL 103  108  107   BUN 8 - 23 mg/dL 19  17  16    Creatinine 0.61 - 1.24 mg/dL 1.22  1.26   1.27   Sodium 135 - 145 mmol/L 138  138  136   Potassium 3.5 - 5.1 mmol/L 4.0  3.7  4.2   Chloride 98 - 111 mmol/L 104  102  104   CO2 22 - 32 mmol/L 28  29  24    Calcium 8.9 - 10.3 mg/dL 9.3  9.2  9.3   Total Protein 6.5 - 8.1 g/dL 7.6  7.6  8.5   Total Bilirubin 0.3 - 1.2 mg/dL 0.6  0.4  0.7   Alkaline Phos 38 - 126 U/L 54  52  61   AST 15 - 41 U/L 18  19  22    ALT 0 - 44 U/L 17  18  24        RADIOGRAPHIC STUDIES: I have personally reviewed the radiological images as listed and agreed with the findings in the report. No results found.    No orders of the defined types were placed in this encounter.  All questions were answered. The patient knows to call the clinic with any problems, questions or concerns. No barriers to learning was detected. The total time spent in the appointment was 20 minutes.     Mike Merle, MD 01/17/2022   I, Mike Mullen, am acting as scribe for Mike Merle, MD.   I have reviewed the above documentation for accuracy and completeness, and I agree with the above.

## 2022-01-30 ENCOUNTER — Other Ambulatory Visit: Payer: Self-pay

## 2022-01-30 ENCOUNTER — Inpatient Hospital Stay: Payer: 59

## 2022-01-30 DIAGNOSIS — D75839 Thrombocytosis, unspecified: Secondary | ICD-10-CM

## 2022-01-30 DIAGNOSIS — D473 Essential (hemorrhagic) thrombocythemia: Secondary | ICD-10-CM | POA: Diagnosis not present

## 2022-01-30 LAB — CBC WITH DIFFERENTIAL/PLATELET
Abs Immature Granulocytes: 0.01 10*3/uL (ref 0.00–0.07)
Basophils Absolute: 0 10*3/uL (ref 0.0–0.1)
Basophils Relative: 1 %
Eosinophils Absolute: 0.1 10*3/uL (ref 0.0–0.5)
Eosinophils Relative: 2 %
HCT: 40.7 % (ref 39.0–52.0)
Hemoglobin: 14.2 g/dL (ref 13.0–17.0)
Immature Granulocytes: 0 %
Lymphocytes Relative: 20 %
Lymphs Abs: 0.8 10*3/uL (ref 0.7–4.0)
MCH: 32.9 pg (ref 26.0–34.0)
MCHC: 34.9 g/dL (ref 30.0–36.0)
MCV: 94.4 fL (ref 80.0–100.0)
Monocytes Absolute: 0.4 10*3/uL (ref 0.1–1.0)
Monocytes Relative: 9 %
Neutro Abs: 2.7 10*3/uL (ref 1.7–7.7)
Neutrophils Relative %: 68 %
Platelets: 603 10*3/uL — ABNORMAL HIGH (ref 150–400)
RBC: 4.31 MIL/uL (ref 4.22–5.81)
RDW: 16.1 % — ABNORMAL HIGH (ref 11.5–15.5)
WBC: 4 10*3/uL (ref 4.0–10.5)
nRBC: 0 % (ref 0.0–0.2)

## 2022-01-30 LAB — COMPREHENSIVE METABOLIC PANEL
ALT: 18 U/L (ref 0–44)
AST: 19 U/L (ref 15–41)
Albumin: 4.4 g/dL (ref 3.5–5.0)
Alkaline Phosphatase: 55 U/L (ref 38–126)
Anion gap: 6 (ref 5–15)
BUN: 13 mg/dL (ref 8–23)
CO2: 29 mmol/L (ref 22–32)
Calcium: 9.2 mg/dL (ref 8.9–10.3)
Chloride: 104 mmol/L (ref 98–111)
Creatinine, Ser: 1.11 mg/dL (ref 0.61–1.24)
GFR, Estimated: >60 mL/min (ref >60)
Glucose, Bld: 105 mg/dL — ABNORMAL HIGH (ref 70–99)
Potassium: 3.7 mmol/L (ref 3.5–5.1)
Sodium: 139 mmol/L (ref 135–145)
Total Bilirubin: 0.5 mg/dL (ref 0.3–1.2)
Total Protein: 7 g/dL (ref 6.5–8.1)

## 2022-02-01 ENCOUNTER — Other Ambulatory Visit (HOSPITAL_COMMUNITY): Payer: Self-pay

## 2022-02-12 ENCOUNTER — Inpatient Hospital Stay: Payer: 59

## 2022-02-12 ENCOUNTER — Inpatient Hospital Stay: Payer: 59 | Attending: Hematology | Admitting: Hematology

## 2022-02-12 ENCOUNTER — Other Ambulatory Visit: Payer: Self-pay

## 2022-02-12 DIAGNOSIS — Z79899 Other long term (current) drug therapy: Secondary | ICD-10-CM | POA: Diagnosis not present

## 2022-02-12 DIAGNOSIS — D473 Essential (hemorrhagic) thrombocythemia: Secondary | ICD-10-CM | POA: Insufficient documentation

## 2022-02-12 DIAGNOSIS — D75839 Thrombocytosis, unspecified: Secondary | ICD-10-CM | POA: Insufficient documentation

## 2022-02-12 LAB — CBC WITH DIFFERENTIAL/PLATELET
Abs Immature Granulocytes: 0 10*3/uL (ref 0.00–0.07)
Basophils Absolute: 0 10*3/uL (ref 0.0–0.1)
Basophils Relative: 1 %
Eosinophils Absolute: 0.1 10*3/uL (ref 0.0–0.5)
Eosinophils Relative: 2 %
HCT: 39.7 % (ref 39.0–52.0)
Hemoglobin: 14.1 g/dL (ref 13.0–17.0)
Immature Granulocytes: 0 %
Lymphocytes Relative: 26 %
Lymphs Abs: 0.9 10*3/uL (ref 0.7–4.0)
MCH: 33.7 pg (ref 26.0–34.0)
MCHC: 35.5 g/dL (ref 30.0–36.0)
MCV: 94.7 fL (ref 80.0–100.0)
Monocytes Absolute: 0.4 10*3/uL (ref 0.1–1.0)
Monocytes Relative: 11 %
Neutro Abs: 2 10*3/uL (ref 1.7–7.7)
Neutrophils Relative %: 60 %
Platelets: 544 10*3/uL — ABNORMAL HIGH (ref 150–400)
RBC: 4.19 MIL/uL — ABNORMAL LOW (ref 4.22–5.81)
RDW: 16.1 % — ABNORMAL HIGH (ref 11.5–15.5)
WBC: 3.3 10*3/uL — ABNORMAL LOW (ref 4.0–10.5)
nRBC: 0 % (ref 0.0–0.2)

## 2022-02-12 NOTE — Progress Notes (Signed)
[facility]   Telephone:(336) [phone] Fax:(336) [phone]   Clinic Follow up Note   Patient Care Team: Default, Provider, MD as PCP - General  Date of Service:  02/12/2022  CHIEF COMPLAINT: f/u of thromobocythemia  CURRENT THERAPY:  Hydrea, started 11/26/21, currently 1537m daily  ASSESSMENT & PLAN:  Mike Mullen a 61y.o. male with   1. Essential Thrombocythemia -h/o isolated thrombocytosis since at least 2015, never had venous or arterial thrombosis. -reports [place]. On review of his limited provided records (from 08/2018 - 10/2021), he was prescribed Hydrea 5048mon 12/21/19, but it's not clear if he ever took it. We do not have the results of his BMB; he will try to find these in his records. -presented to ED on 11/15/21 with dizziness. He was found to have platelet count of 927k, the rest of CBC was normal. Brain MRI was negative. -abdomen USKorean 11/26/21 was negative. 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 -labs reviewed, his platelet count is down to 544k today, which is greatly improved. Of note, WBC down to 3.3, secondary to Hydrea. We will continue this current dose. -plan to repeat labs in 3 and 6 weeks     PLAN:  -continue Hydrea to 1500m15mily, he was on 1000mg59mand 500mg 77mwill change to 1500mg a5m improve compliance  -lab in 3 and 6 weeks -f/u in 6 weeks   No problem-specific Assessment & Plan notes found for this encounter.   INTERVAL HISTORY:  [name] for a follow up of ET. He was last seen by me on 01/17/22. He presents to the clinic alone. He reports he is doing well overall. He notes he forgets to take his evening dose of hydrea sometimes as he is not in the habit yet.   All other systems were reviewed with the patient  and are negative.  MEDICAL HISTORY:  Past Medical History:  Diagnosis Date   Hypertension    Inguinal hernia    left    SURGICAL HISTORY: Past Surgical History:  Procedure Laterality Date   HERNIA REPAIR     INGUINAL HERNIA REPAIR Left 02/19/2016   Procedure: OPEN LEFT INGUINAL HERNIA REPAIR;  Surgeon: James WJudeth HornLocation: [facility];  Laterality: Left;   INSERTION OF MESH Left 02/19/2016   Procedure: INSERTION OF MESH;  Surgeon: James WJudeth HornLocation[facility];  Laterality: Left;    I have reviewed the social history and family history with the patient and they are unchanged from previous note.  ALLERGIES:  has No Known Allergies.  MEDICATIONS:  Current Outpatient Medications  Medication Sig Dispense Refill   hydroxyurea (HYDREA) 500 MG capsule Take 2 capsules by mouth once in morning and take 1 capsule in the evening 90 capsule 1   No current facility-administered medications for this visit.    PHYSICAL EXAMINATION: ECOG PERFORMANCE STATUS: 0 - Asymptomatic  Vitals:   02/12/22 0951  BP: 131/79  Pulse: 75  Resp: 15  Temp: 97.7 F (36.5 C)  SpO2: 97%   Wt Readings from Last 3 Encounters:  02/12/22 251 lb 9.6 oz (114.1 kg)  01/17/22 248 lb 4.8 oz (112.6 kg)  11/16/21 248 lb 14.4  oz (112.9 kg)     GENERAL:alert, no distress and comfortable SKIN: skin color normal, no rashes or significant lesions EYES: normal, Conjunctiva are pink and non-injected, sclera clear  NEURO: alert & oriented x 3 with fluent speech  LABORATORY DATA:  I have reviewed the data as listed    Latest Ref Rng & Units 02/12/2022    9:26 AM 01/30/2022    9:09 AM 01/09/2022    9:18 AM  CBC  WBC 4.0 - 10.5 K/uL 3.3  4.0  4.9   Hemoglobin 13.0 - 17.0 g/dL 14.1  14.2  14.2   Hematocrit 39.0 - 52.0 % 39.7  40.7  41.7   Platelets 150 - 400 K/uL 544  603  872         Latest Ref Rng & Units 01/30/2022    9:09 AM  12/12/2021    7:51 AM 11/26/2021    7:53 AM  CMP  Glucose 70 - 99 mg/dL 105  103  108   BUN 8 - 23 mg/dL 13  19  17    Creatinine 0.61 - 1.24 mg/dL 1.11  1.22  1.26   Sodium 135 - 145 mmol/L 139  138  138   Potassium 3.5 - 5.1 mmol/L 3.7  4.0  3.7   Chloride 98 - 111 mmol/L 104  104  102   CO2 22 - 32 mmol/L 29  28  29    Calcium 8.9 - 10.3 mg/dL 9.2  9.3  9.2   Total Protein 6.5 - 8.1 g/dL 7.0  7.6  7.6   Total Bilirubin 0.3 - 1.2 mg/dL 0.5  0.6  0.4   Alkaline Phos 38 - 126 U/L 55  54  52   AST 15 - 41 U/L 19  18  19    ALT 0 - 44 U/L 18  17  18        RADIOGRAPHIC STUDIES: I have personally reviewed the radiological images as listed and agreed with the findings in the report. No results found.    No orders of the defined types were placed in this encounter.  All questions were answered. The patient knows to call the clinic with any problems, questions or concerns. No barriers to learning was detected. The total time spent in the appointment was 15 minutes.     Truitt Merle, MD 02/12/2022   I, Wilburn Mylar, am acting as scribe for Truitt Merle, MD.   I have reviewed the above documentation for accuracy and completeness, and I agree with the above.

## 2022-03-06 ENCOUNTER — Inpatient Hospital Stay: Payer: 59

## 2022-03-06 ENCOUNTER — Other Ambulatory Visit: Payer: Self-pay

## 2022-03-06 DIAGNOSIS — D473 Essential (hemorrhagic) thrombocythemia: Secondary | ICD-10-CM | POA: Diagnosis not present

## 2022-03-06 DIAGNOSIS — D75839 Thrombocytosis, unspecified: Secondary | ICD-10-CM

## 2022-03-06 LAB — CBC WITH DIFFERENTIAL/PLATELET
Abs Immature Granulocytes: 0.01 10*3/uL (ref 0.00–0.07)
Basophils Absolute: 0 10*3/uL (ref 0.0–0.1)
Basophils Relative: 1 %
Eosinophils Absolute: 0.1 10*3/uL (ref 0.0–0.5)
Eosinophils Relative: 3 %
HCT: 38 % — ABNORMAL LOW (ref 39.0–52.0)
Hemoglobin: 13.6 g/dL (ref 13.0–17.0)
Immature Granulocytes: 0 %
Lymphocytes Relative: 27 %
Lymphs Abs: 0.8 10*3/uL (ref 0.7–4.0)
MCH: 34.9 pg — ABNORMAL HIGH (ref 26.0–34.0)
MCHC: 35.8 g/dL (ref 30.0–36.0)
MCV: 97.4 fL (ref 80.0–100.0)
Monocytes Absolute: 0.3 10*3/uL (ref 0.1–1.0)
Monocytes Relative: 11 %
Neutro Abs: 1.8 10*3/uL (ref 1.7–7.7)
Neutrophils Relative %: 58 %
Platelets: 500 10*3/uL — ABNORMAL HIGH (ref 150–400)
RBC: 3.9 MIL/uL — ABNORMAL LOW (ref 4.22–5.81)
RDW: 15.6 % — ABNORMAL HIGH (ref 11.5–15.5)
WBC: 3.1 10*3/uL — ABNORMAL LOW (ref 4.0–10.5)
nRBC: 0 % (ref 0.0–0.2)

## 2022-03-06 LAB — COMPREHENSIVE METABOLIC PANEL
ALT: 19 U/L (ref 0–44)
AST: 17 U/L (ref 15–41)
Albumin: 4.3 g/dL (ref 3.5–5.0)
Alkaline Phosphatase: 59 U/L (ref 38–126)
Anion gap: 5 (ref 5–15)
BUN: 11 mg/dL (ref 8–23)
CO2: 28 mmol/L (ref 22–32)
Calcium: 9.2 mg/dL (ref 8.9–10.3)
Chloride: 104 mmol/L (ref 98–111)
Creatinine, Ser: 0.98 mg/dL (ref 0.61–1.24)
GFR, Estimated: >60 mL/min (ref >60)
Glucose, Bld: 107 mg/dL — ABNORMAL HIGH (ref 70–99)
Potassium: 3.7 mmol/L (ref 3.5–5.1)
Sodium: 137 mmol/L (ref 135–145)
Total Bilirubin: 0.3 mg/dL (ref 0.3–1.2)
Total Protein: 6.9 g/dL (ref 6.5–8.1)

## 2022-03-11 ENCOUNTER — Other Ambulatory Visit (HOSPITAL_COMMUNITY): Payer: Self-pay

## 2022-03-11 ENCOUNTER — Other Ambulatory Visit: Payer: Self-pay

## 2022-03-11 DIAGNOSIS — D75839 Thrombocytosis, unspecified: Secondary | ICD-10-CM

## 2022-03-11 MED ORDER — HYDROXYUREA 500 MG PO CAPS
1500.0000 mg | ORAL_CAPSULE | Freq: Every day | ORAL | 1 refills | Status: DC
  Filled 2022-03-11 (×2): qty 90, 30d supply, fill #0

## 2022-03-11 NOTE — Progress Notes (Signed)
Refill prescription sent to [facility] for Hydrea.

## 2022-03-12 ENCOUNTER — Other Ambulatory Visit (HOSPITAL_COMMUNITY): Payer: Self-pay

## 2022-03-27 ENCOUNTER — Encounter: Payer: Self-pay | Admitting: Hematology

## 2022-03-27 ENCOUNTER — Other Ambulatory Visit: Payer: Self-pay

## 2022-03-27 ENCOUNTER — Inpatient Hospital Stay: Payer: 59 | Attending: Hematology | Admitting: Hematology

## 2022-03-27 ENCOUNTER — Inpatient Hospital Stay: Payer: 59

## 2022-03-27 VITALS — BP 124/82 | HR 84 | Temp 98.1°F | Resp 18 | Ht 76.0 in | Wt 248.7 lb

## 2022-03-27 DIAGNOSIS — D473 Essential (hemorrhagic) thrombocythemia: Secondary | ICD-10-CM | POA: Insufficient documentation

## 2022-03-27 DIAGNOSIS — D75839 Thrombocytosis, unspecified: Secondary | ICD-10-CM

## 2022-03-27 DIAGNOSIS — Z79899 Other long term (current) drug therapy: Secondary | ICD-10-CM | POA: Insufficient documentation

## 2022-03-27 LAB — CBC WITH DIFFERENTIAL/PLATELET
Abs Immature Granulocytes: 0.01 10*3/uL (ref 0.00–0.07)
Basophils Absolute: 0 10*3/uL (ref 0.0–0.1)
Basophils Relative: 0 %
Eosinophils Absolute: 0 10*3/uL (ref 0.0–0.5)
Eosinophils Relative: 1 %
HCT: 40.8 % (ref 39.0–52.0)
Hemoglobin: 14.6 g/dL (ref 13.0–17.0)
Immature Granulocytes: 0 %
Lymphocytes Relative: 25 %
Lymphs Abs: 0.9 10*3/uL (ref 0.7–4.0)
MCH: 36 pg — ABNORMAL HIGH (ref 26.0–34.0)
MCHC: 35.8 g/dL (ref 30.0–36.0)
MCV: 100.7 fL — ABNORMAL HIGH (ref 80.0–100.0)
Monocytes Absolute: 0.3 10*3/uL (ref 0.1–1.0)
Monocytes Relative: 8 %
Neutro Abs: 2.3 10*3/uL (ref 1.7–7.7)
Neutrophils Relative %: 66 %
Platelets: 409 10*3/uL — ABNORMAL HIGH (ref 150–400)
RBC: 4.05 MIL/uL — ABNORMAL LOW (ref 4.22–5.81)
RDW: 15.4 % (ref 11.5–15.5)
WBC: 3.6 10*3/uL — ABNORMAL LOW (ref 4.0–10.5)
nRBC: 0 % (ref 0.0–0.2)

## 2022-03-27 NOTE — Progress Notes (Signed)
[facility]   Telephone:(336) [phone] Fax:(336) [phone]   Clinic Follow up Note   Patient Care Team: Default, Provider, MD as PCP - General  Date of Service:  03/27/2022  CHIEF COMPLAINT: f/u of thrombocytopenia  CURRENT THERAPY:  Hydrea, started 11/26/21, currently 1500mg  daily  ASSESSMENT & PLAN:  Mike Mullen is a 61 y.o. male with   1. Essential Thrombocythemia -h/o isolated thrombocytosis since at least 2015, never had venous or arterial thrombosis. -reports he was previously evaluated in 2018 while living in [place], had a bone marrow biopsy around that time. On review of his limited provided records (from 08/2018 - 10/2021), he was prescribed Hydrea 500mg  on 12/21/19, but it's not clear if he ever took it. We do not have the results of his BMB; he will try to find these in his records. -presented to ED on 11/15/21 with dizziness. He was found to have platelet count of 927k, the rest of CBC was normal. Brain MRI was negative. -abdomen US on 11/26/21 was negative. 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 -labs reviewed, his platelet count is down to 409k today, which is greatly improved. Of note, WBC stable at 3.6, secondary to Hydrea. We will continue this current dose for now. We reviewed what to watch for-- fever or bleeding.      PLAN:  -continue Hydrea to 1500mg  daily -lab in 1, 2, and 4 months -f/u in 4 months   No problem-specific Assessment & Plan notes found for this encounter.   INTERVAL HISTORY:  Mike Mullen is here for a follow up of thrombocytopenia. He was last seen by me on 02/12/22. He presents to the clinic alone. He reports he is doing well overall. He denies any new concerns.   All other systems were reviewed with the patient and are negative.  MEDICAL HISTORY:  Past Medical History:  Diagnosis Date   Hypertension     Inguinal hernia    left    SURGICAL HISTORY: Past Surgical History:  Procedure Laterality Date   HERNIA REPAIR     INGUINAL HERNIA REPAIR Left 02/19/2016   Procedure: OPEN LEFT INGUINAL HERNIA REPAIR;  Surgeon: Judeth Horn, MD;  Location: [facility];  Service: General;  Laterality: Left;   INSERTION OF MESH Left 02/19/2016   Procedure: INSERTION OF MESH;  Surgeon: Judeth Horn, MD;  Location: [facility];  Service: General;  Laterality: Left;    I have reviewed the social history and family history with the patient and they are unchanged from previous note.  ALLERGIES:  has No Known Allergies.  MEDICATIONS:  Current Outpatient Medications  Medication Sig Dispense Refill   hydroxyurea (HYDREA) 500 MG capsule Take 3 capsules by mouth daily every morning. May take with food to minimize GI side effects. 90 capsule 1   No current facility-administered medications for this visit.    PHYSICAL EXAMINATION: ECOG PERFORMANCE STATUS: 0 - Asymptomatic  Vitals:   03/27/22 0956  BP: 124/82  Pulse: 84  Resp: 18  Temp: 98.1 F (36.7 C)  SpO2: 99%   Wt Readings from Last 3 Encounters:  03/27/22 248 lb 11.2 oz (112.8 kg)  02/12/22 251 lb 9.6 oz (114.1 kg)  01/17/22 248 lb 4.8 oz (112.6 kg)     GENERAL:alert, no distress and comfortable SKIN: skin color normal, no rashes or significant lesions EYES: normal, Conjunctiva are pink  and non-injected, sclera clear  NEURO: alert & oriented x 3 with fluent speech  LABORATORY DATA:  I have reviewed the data as listed    Latest Ref Rng & Units 03/27/2022    9:31 AM 03/06/2022    8:58 AM 02/12/2022    9:26 AM  CBC  WBC 4.0 - 10.5 K/uL 3.6  3.1  3.3   Hemoglobin 13.0 - 17.0 g/dL 14.6  13.6  14.1   Hematocrit 39.0 - 52.0 % 40.8  38.0  39.7   Platelets 150 - 400 K/uL 409  500  544         Latest Ref Rng & Units 03/06/2022    8:58 AM 01/30/2022    9:09 AM 12/12/2021    7:51 AM  CMP  Glucose 70 - 99 mg/dL 107  105   103   BUN 8 - 23 mg/dL _0 Creatinine 0.61 - 1.24 mg/dL 0.98  1.11  1.22   Sodium 135 - 145 mmol/L 137  139  138   Potassium 3.5 - 5.1 mmol/L 3.7  3.7  4.0   Chloride 98 - 111 mmol/L 104  104  104   CO2 22 - 32 mmol/L _1 Calcium 8.9 - 10.3 mg/dL 9.2  9.2  9.3   Total Protein 6.5 - 8.1 g/dL 6.9  7.0  7.6   Total Bilirubin 0.3 - 1.2 mg/dL 0.3  0.5  0.6   Alkaline Phos 38 - 126 U/L 59  55  54   AST 15 - 41 U/L _2 ALT 0 - 44 U/L _3 RADIOGRAPHIC STUDIES: I have personally reviewed the radiological images as listed and agreed with the findings in the report. No results found.    No orders of the defined types were placed in this encounter.  All questions were answered. The patient knows to call the clinic with any problems, questions or concerns. No barriers to learning was detected. The total time spent in the appointment was 20 minutes.     Truitt Merle, MD 03/27/2022   I, Wilburn Mylar, am acting as scribe for Truitt Merle, MD.   I have reviewed the above documentation for accuracy and completeness, and I agree with the above.

## 2022-03-28 ENCOUNTER — Ambulatory Visit (INDEPENDENT_AMBULATORY_CARE_PROVIDER_SITE_OTHER): Payer: 59 | Admitting: Podiatry

## 2022-03-28 DIAGNOSIS — L84 Corns and callosities: Secondary | ICD-10-CM | POA: Diagnosis not present

## 2022-03-28 DIAGNOSIS — Q828 Other specified congenital malformations of skin: Secondary | ICD-10-CM | POA: Diagnosis not present

## 2022-03-28 NOTE — Progress Notes (Signed)
  Subjective:  Patient ID: Mike Mullen, male    DOB: 1960/07/31,  MRN: [id number]  Chief Complaint  Patient presents with   Callouses    Patient is here for right foot callous on the bottom of right foot.    61 y.o. male presents with the above complaint. History confirmed with patient.  Patient presents for a painful lesion on the bottom of his right heel.  He states that this has been present for weeks to months at this time.  This is the first time he has dealt with anything like this.  Has not attempted much treatment of it.  States that it feels like he is walking on a hard pebble in his shoe.  Does not use daily moisturizing lotion for his feet at this time.  Objective:  Physical Exam: warm, good capillary refill, nail exam normal nails without lesions,  Attention directed to the right plantar heel there is an area of hyperkeratotic tissue formation at the plantar lateral aspect of the heel pad.  There is a central core of compacted hyperkeratotic tissue present.  Overall the lesion is consistent with plantar porokeratosis DP pulses palpable, PT pulses palpable, and protective sensation intact Left Foot: normal exam, no swelling, tenderness, instability; ligaments intact, full range of motion of all ankle/foot joints  Right Foot: point tenderness over the heel pad   No images are attached to the encounter.  Assessment:   1. Porokeratosis   2. Callus of foot      Plan:  Patient was evaluated and treated and all questions answered.  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  Recommend we proceed with debridement of the porokeratosis with removal of the central nucleated core.  Patient is agreeable. All symptomatic hyperkeratoses were safely debrided with a sterile #15 blade to patient's level of comfort without  incident. We discussed preventative and palliative care of these lesions including supportive and accommodative shoegear, padding, prefabricated and custom molded accommodative orthoses, use of a pumice stone and lotions/creams daily.  Patient will follow-up if the lesion recurs        [name], [facility] / [facility]

## 2022-04-17 ENCOUNTER — Other Ambulatory Visit (HOSPITAL_COMMUNITY): Payer: Self-pay

## 2022-04-17 ENCOUNTER — Telehealth: Payer: Self-pay

## 2022-04-17 MED ORDER — HYDROXYUREA 500 MG PO CAPS
1500.0000 mg | ORAL_CAPSULE | Freq: Every day | ORAL | 1 refills | Status: DC
  Filled 2022-04-17: qty 90, 30d supply, fill #0

## 2022-04-17 NOTE — Telephone Encounter (Signed)
Pt called requesting a refill on his Hydrea to be sent to [facility].  Refill sent.

## 2022-04-18 ENCOUNTER — Other Ambulatory Visit (HOSPITAL_COMMUNITY): Payer: Self-pay

## 2022-04-24 ENCOUNTER — Inpatient Hospital Stay: Payer: 59 | Attending: Hematology

## 2022-04-24 ENCOUNTER — Other Ambulatory Visit: Payer: Self-pay

## 2022-04-24 DIAGNOSIS — D75839 Thrombocytosis, unspecified: Secondary | ICD-10-CM

## 2022-04-24 DIAGNOSIS — D473 Essential (hemorrhagic) thrombocythemia: Secondary | ICD-10-CM | POA: Insufficient documentation

## 2022-04-24 LAB — COMPREHENSIVE METABOLIC PANEL
ALT: 22 U/L (ref 0–44)
AST: 19 U/L (ref 15–41)
Albumin: 4.6 g/dL (ref 3.5–5.0)
Alkaline Phosphatase: 56 U/L (ref 38–126)
Anion gap: 5 (ref 5–15)
BUN: 15 mg/dL (ref 8–23)
CO2: 31 mmol/L (ref 22–32)
Calcium: 9.2 mg/dL (ref 8.9–10.3)
Chloride: 102 mmol/L (ref 98–111)
Creatinine, Ser: 1.15 mg/dL (ref 0.61–1.24)
GFR, Estimated: >60 mL/min (ref >60)
Glucose, Bld: 111 mg/dL — ABNORMAL HIGH (ref 70–99)
Potassium: 3.9 mmol/L (ref 3.5–5.1)
Sodium: 138 mmol/L (ref 135–145)
Total Bilirubin: 0.7 mg/dL (ref 0.3–1.2)
Total Protein: 7.8 g/dL (ref 6.5–8.1)

## 2022-04-24 LAB — CBC WITH DIFFERENTIAL/PLATELET
Abs Immature Granulocytes: 0.01 10*3/uL (ref 0.00–0.07)
Basophils Absolute: 0 10*3/uL (ref 0.0–0.1)
Basophils Relative: 0 %
Eosinophils Absolute: 0 10*3/uL (ref 0.0–0.5)
Eosinophils Relative: 1 %
HCT: 40 % (ref 39.0–52.0)
Hemoglobin: 14.3 g/dL (ref 13.0–17.0)
Immature Granulocytes: 0 %
Lymphocytes Relative: 23 %
Lymphs Abs: 0.8 10*3/uL (ref 0.7–4.0)
MCH: 37.8 pg — ABNORMAL HIGH (ref 26.0–34.0)
MCHC: 35.8 g/dL (ref 30.0–36.0)
MCV: 105.8 fL — ABNORMAL HIGH (ref 80.0–100.0)
Monocytes Absolute: 0.3 10*3/uL (ref 0.1–1.0)
Monocytes Relative: 10 %
Neutro Abs: 2.2 10*3/uL (ref 1.7–7.7)
Neutrophils Relative %: 66 %
Platelets: 361 10*3/uL (ref 150–400)
RBC: 3.78 MIL/uL — ABNORMAL LOW (ref 4.22–5.81)
RDW: 14.2 % (ref 11.5–15.5)
WBC: 3.3 10*3/uL — ABNORMAL LOW (ref 4.0–10.5)
nRBC: 0 % (ref 0.0–0.2)

## 2022-05-29 ENCOUNTER — Inpatient Hospital Stay: Payer: 59 | Attending: Hematology

## 2022-05-29 ENCOUNTER — Other Ambulatory Visit: Payer: Self-pay

## 2022-05-29 DIAGNOSIS — D473 Essential (hemorrhagic) thrombocythemia: Secondary | ICD-10-CM | POA: Diagnosis present

## 2022-05-29 DIAGNOSIS — D75839 Thrombocytosis, unspecified: Secondary | ICD-10-CM

## 2022-05-29 LAB — CBC WITH DIFFERENTIAL/PLATELET
Abs Immature Granulocytes: 0 10*3/uL (ref 0.00–0.07)
Basophils Absolute: 0 10*3/uL (ref 0.0–0.1)
Basophils Relative: 0 %
Eosinophils Absolute: 0 10*3/uL (ref 0.0–0.5)
Eosinophils Relative: 1 %
HCT: 39.9 % (ref 39.0–52.0)
Hemoglobin: 14.4 g/dL (ref 13.0–17.0)
Immature Granulocytes: 0 %
Lymphocytes Relative: 21 %
Lymphs Abs: 0.6 10*3/uL — ABNORMAL LOW (ref 0.7–4.0)
MCH: 39.3 pg — ABNORMAL HIGH (ref 26.0–34.0)
MCHC: 36.1 g/dL — ABNORMAL HIGH (ref 30.0–36.0)
MCV: 109 fL — ABNORMAL HIGH (ref 80.0–100.0)
Monocytes Absolute: 0.3 10*3/uL (ref 0.1–1.0)
Monocytes Relative: 9 %
Neutro Abs: 2.1 10*3/uL (ref 1.7–7.7)
Neutrophils Relative %: 69 %
Platelets: 307 10*3/uL (ref 150–400)
RBC: 3.66 MIL/uL — ABNORMAL LOW (ref 4.22–5.81)
RDW: 13.1 % (ref 11.5–15.5)
WBC: 3 10*3/uL — ABNORMAL LOW (ref 4.0–10.5)
nRBC: 0 % (ref 0.0–0.2)

## 2022-06-03 ENCOUNTER — Other Ambulatory Visit (HOSPITAL_COMMUNITY): Payer: Self-pay

## 2022-06-03 ENCOUNTER — Other Ambulatory Visit: Payer: Self-pay

## 2022-06-03 MED ORDER — HYDROXYUREA 500 MG PO CAPS
1500.0000 mg | ORAL_CAPSULE | Freq: Every day | ORAL | 1 refills | Status: DC
  Filled 2022-06-03 – 2022-06-20 (×2): qty 90, 30d supply, fill #0

## 2022-06-04 ENCOUNTER — Other Ambulatory Visit (HOSPITAL_COMMUNITY): Payer: Self-pay

## 2022-06-20 ENCOUNTER — Other Ambulatory Visit (HOSPITAL_COMMUNITY): Payer: Self-pay

## 2022-07-23 NOTE — Progress Notes (Signed)
[place]   [phone] Fax:([phone]   Clinic Follow up Note   Patient Care Team: Default, Provider, MD as PCP - General  Date of Service:  07/23/2022  CHIEF COMPLAINT: f/u of thrombocytopenia   CURRENT THERAPY:  Hydrea, started 11/26/21, currently '1500mg'$  daily    ASSESSMENT: *** Mike Mullen is a 62 y.o. male with   No problem-specific Assessment & Plan notes found for this encounter.  ***   PLAN: {Everything Dr. Burr Medico talks to pt about, including reviewing scans and labs. } -{proceed with ***} -{lab with/without flush and f/u when?}   SUMMARY OF ONCOLOGIC HISTORY: Oncology History   No history exists.     INTERVAL HISTORY: *** Mike Mullen is here for a follow up of thrombocytopenia   He was last seen by me on 03/27/22 He presents to the clinic    All other systems were reviewed with the patient and are negative.  MEDICAL HISTORY:  Past Medical History:  Diagnosis Date   Hypertension    Inguinal hernia    left    SURGICAL HISTORY: Past Surgical History:  Procedure Laterality Date   HERNIA REPAIR     INGUINAL HERNIA REPAIR Left 02/19/2016   Procedure: OPEN LEFT INGUINAL HERNIA REPAIR;  Surgeon: Judeth Horn, MD;  Location: [facility];  Service: General;  Laterality: Left;   INSERTION OF MESH Left 02/19/2016   Procedure: INSERTION OF MESH;  Surgeon: Judeth Horn, MD;  Location: [facility];  Service: General;  Laterality: Left;    I have reviewed the social history and family history with the patient and they are unchanged from previous note.  ALLERGIES:  has No Known Allergies.  MEDICATIONS:  Current Outpatient Medications  Medication Sig Dispense Refill   hydroxyurea (HYDREA) 500 MG capsule Take 3 capsules by mouth daily every morning. May take with food to minimize GI side effects. 90 capsule 1   No current facility-administered medications for this visit.    PHYSICAL EXAMINATION: ECOG  PERFORMANCE STATUS: {CHL ONC ECOG PS:[phone]}  There were no vitals filed for this visit. Wt Readings from Last 3 Encounters:  03/27/22 248 lb 11.2 oz (112.8 kg)  02/12/22 251 lb 9.6 oz (114.1 kg)  01/17/22 248 lb 4.8 oz (112.6 kg)    {Only keep what was examined. If exam not performed, can use .CEXAM } GENERAL:alert, no distress and comfortable SKIN: skin color, texture, turgor are normal, no rashes or significant lesions EYES: normal, Conjunctiva are pink and non-injected, sclera clear {OROPHARYNX:no exudate, no erythema and lips, buccal mucosa, and tongue normal}  NECK: supple, thyroid normal size, non-tender, without nodularity LYMPH:  no palpable lymphadenopathy in the cervical, axillary {or inguinal} LUNGS: clear to auscultation and percussion with normal breathing effort HEART: regular rate & rhythm and no murmurs and no lower extremity edema ABDOMEN:abdomen soft, non-tender and normal bowel sounds Musculoskeletal:no cyanosis of digits and no clubbing  NEURO: alert & oriented x 3 with fluent speech, no focal motor/sensory deficits  LABORATORY DATA:  I have reviewed the data as listed    Latest Ref Rng & Units 05/29/2022    9:09 AM 04/24/2022    9:15 AM 03/27/2022    9:31 AM  CBC  WBC 4.0 - 10.5 K/uL 3.0  3.3  3.6   Hemoglobin 13.0 - 17.0 g/dL 14.4  14.3  14.6   Hematocrit 39.0 - 52.0 % 39.9  40.0  40.8   Platelets 150 - 400 K/uL 307  361  409         Latest Ref Rng & Units 04/24/2022    9:15 AM 03/06/2022    8:58 AM 01/30/2022    9:09 AM  CMP  Glucose 70 - 99 mg/dL 111  107  105   BUN 8 - 23 mg/dL '15  11  13   '$ Creatinine 0.61 - 1.24 mg/dL 1.15  0.98  1.11   Sodium 135 - 145 mmol/L 138  137  139   Potassium 3.5 - 5.1 mmol/L 3.9  3.7  3.7   Chloride 98 - 111 mmol/L 102  104  104   CO2 22 - 32 mmol/L '31  28  29   '$ Calcium 8.9 - 10.3 mg/dL 9.2  9.2  9.2   Total Protein 6.5 - 8.1 g/dL 7.8  6.9  7.0   Total Bilirubin 0.3 - 1.2 mg/dL 0.7  0.3  0.5   Alkaline Phos 38  - 126 U/L 56  59  55   AST 15 - 41 U/L '19  17  19   '$ ALT 0 - 44 U/L '22  19  18       '$ RADIOGRAPHIC STUDIES: I have personally reviewed the radiological images as listed and agreed with the findings in the report. No results found.    No orders of the defined types were placed in this encounter.  All questions were answered. The patient knows to call the clinic with any problems, questions or concerns. No barriers to learning was detected. The total time spent in the appointment was {CHL ONC TIME VISIT - VVZSM:[id number]}.     Baldemar Friday, CMA 07/23/2022   I, Audry Riles, CMA, am acting as scribe for Truitt Merle, MD.   {Add scribe attestation statement}

## 2022-07-23 NOTE — Assessment & Plan Note (Signed)
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 -reports he was previously evaluated in 2018 while living in [place], had a bone marrow biopsy around that time. On review of his limited provided records (from 08/2018 - 10/2021), he was prescribed Hydrea '500mg'$  on 12/21/19, but it's not clear if he ever took it. We do not have the results of his BMB; he will try to find these in his records. -presented to ED on 11/15/21 with dizziness. He was found to have platelet count of 927k, the rest of CBC was normal. Brain MRI was negative. -abdomen US on 11/26/21 was negative. 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

## 2022-07-24 ENCOUNTER — Encounter: Payer: Self-pay | Admitting: Hematology

## 2022-07-24 ENCOUNTER — Inpatient Hospital Stay: Payer: 59 | Attending: Hematology

## 2022-07-24 ENCOUNTER — Inpatient Hospital Stay (HOSPITAL_BASED_OUTPATIENT_CLINIC_OR_DEPARTMENT_OTHER): Payer: 59 | Admitting: Hematology

## 2022-07-24 ENCOUNTER — Other Ambulatory Visit (HOSPITAL_COMMUNITY): Payer: Self-pay

## 2022-07-24 ENCOUNTER — Other Ambulatory Visit (HOSPITAL_BASED_OUTPATIENT_CLINIC_OR_DEPARTMENT_OTHER): Payer: Self-pay

## 2022-07-24 VITALS — BP 111/76 | HR 92 | Temp 97.9°F | Resp 18 | Ht 76.0 in | Wt 240.3 lb

## 2022-07-24 DIAGNOSIS — D473 Essential (hemorrhagic) thrombocythemia: Secondary | ICD-10-CM | POA: Insufficient documentation

## 2022-07-24 DIAGNOSIS — D75839 Thrombocytosis, unspecified: Secondary | ICD-10-CM

## 2022-07-24 LAB — COMPREHENSIVE METABOLIC PANEL
ALT: 23 U/L (ref 0–44)
AST: 18 U/L (ref 15–41)
Albumin: 4.4 g/dL (ref 3.5–5.0)
Alkaline Phosphatase: 60 U/L (ref 38–126)
Anion gap: 4 — ABNORMAL LOW (ref 5–15)
BUN: 13 mg/dL (ref 8–23)
CO2: 31 mmol/L (ref 22–32)
Calcium: 9.5 mg/dL (ref 8.9–10.3)
Chloride: 103 mmol/L (ref 98–111)
Creatinine, Ser: 1.08 mg/dL (ref 0.61–1.24)
GFR, Estimated: >60 mL/min (ref >60)
Glucose, Bld: 94 mg/dL (ref 70–99)
Potassium: 4 mmol/L (ref 3.5–5.1)
Sodium: 138 mmol/L (ref 135–145)
Total Bilirubin: 0.5 mg/dL (ref 0.3–1.2)
Total Protein: 7.5 g/dL (ref 6.5–8.1)

## 2022-07-24 LAB — CBC WITH DIFFERENTIAL/PLATELET
Abs Immature Granulocytes: 0.01 10*3/uL (ref 0.00–0.07)
Basophils Absolute: 0 10*3/uL (ref 0.0–0.1)
Basophils Relative: 1 %
Eosinophils Absolute: 0 10*3/uL (ref 0.0–0.5)
Eosinophils Relative: 1 %
HCT: 38.5 % — ABNORMAL LOW (ref 39.0–52.0)
Hemoglobin: 13.8 g/dL (ref 13.0–17.0)
Immature Granulocytes: 0 %
Lymphocytes Relative: 17 %
Lymphs Abs: 0.8 10*3/uL (ref 0.7–4.0)
MCH: 39.9 pg — ABNORMAL HIGH (ref 26.0–34.0)
MCHC: 35.8 g/dL (ref 30.0–36.0)
MCV: 111.3 fL — ABNORMAL HIGH (ref 80.0–100.0)
Monocytes Absolute: 0.3 10*3/uL (ref 0.1–1.0)
Monocytes Relative: 6 %
Neutro Abs: 3.4 10*3/uL (ref 1.7–7.7)
Neutrophils Relative %: 75 %
Platelets: 442 10*3/uL — ABNORMAL HIGH (ref 150–400)
RBC: 3.46 MIL/uL — ABNORMAL LOW (ref 4.22–5.81)
RDW: 11.8 % (ref 11.5–15.5)
WBC: 4.4 10*3/uL (ref 4.0–10.5)
nRBC: 0 % (ref 0.0–0.2)

## 2022-07-24 MED ORDER — HYDROXYUREA 500 MG PO CAPS
1500.0000 mg | ORAL_CAPSULE | Freq: Every day | ORAL | 1 refills | Status: DC
  Filled 2022-07-24: qty 90, 30d supply, fill #0
  Filled 2022-08-24: qty 90, 30d supply, fill #1

## 2022-08-15 DIAGNOSIS — R3915 Urgency of urination: Secondary | ICD-10-CM

## 2022-08-15 DIAGNOSIS — N138 Other obstructive and reflux uropathy: Secondary | ICD-10-CM

## 2022-08-15 DIAGNOSIS — R3912 Poor urinary stream: Secondary | ICD-10-CM

## 2022-08-15 DIAGNOSIS — R350 Frequency of micturition: Secondary | ICD-10-CM

## 2022-08-15 HISTORY — DX: Frequency of micturition: R35.0

## 2022-08-15 HISTORY — DX: Poor urinary stream: R39.12

## 2022-08-15 HISTORY — DX: Other obstructive and reflux uropathy: N13.8

## 2022-08-15 HISTORY — DX: Urgency of urination: R39.15

## 2022-09-03 ENCOUNTER — Encounter: Payer: Self-pay | Admitting: Urology

## 2022-09-03 ENCOUNTER — Other Ambulatory Visit: Payer: Self-pay | Admitting: Urology

## 2022-09-03 DIAGNOSIS — R972 Elevated prostate specific antigen [PSA]: Secondary | ICD-10-CM

## 2022-09-18 ENCOUNTER — Other Ambulatory Visit: Payer: Self-pay | Admitting: Urology

## 2022-09-18 DIAGNOSIS — M795 Residual foreign body in soft tissue: Secondary | ICD-10-CM

## 2022-09-18 DIAGNOSIS — R972 Elevated prostate specific antigen [PSA]: Secondary | ICD-10-CM

## 2022-10-15 ENCOUNTER — Other Ambulatory Visit: Payer: Self-pay | Admitting: Hematology

## 2022-10-15 ENCOUNTER — Other Ambulatory Visit (HOSPITAL_COMMUNITY): Payer: Self-pay

## 2022-10-15 ENCOUNTER — Other Ambulatory Visit: Payer: Self-pay

## 2022-10-15 MED ORDER — HYDROXYUREA 500 MG PO CAPS
1500.0000 mg | ORAL_CAPSULE | Freq: Every day | ORAL | 1 refills | Status: DC
  Filled 2022-10-15: qty 90, 30d supply, fill #0

## 2022-10-16 ENCOUNTER — Other Ambulatory Visit (HOSPITAL_COMMUNITY): Payer: Self-pay

## 2022-10-18 ENCOUNTER — Ambulatory Visit
Admission: RE | Admit: 2022-10-18 | Discharge: 2022-10-18 | Disposition: A | Discharge status: Home / Self Care | Payer: 59 | Source: Ambulatory Visit | Attending: Urology | Admitting: Urology

## 2022-10-18 ENCOUNTER — Inpatient Hospital Stay: Admission: RE | Admit: 2022-10-18 | Payer: 59 | Source: Ambulatory Visit

## 2022-10-18 DIAGNOSIS — R972 Elevated prostate specific antigen [PSA]: Secondary | ICD-10-CM

## 2022-10-18 MED ORDER — GADOPICLENOL 0.5 MMOL/ML IV SOLN
10.0000 mL | Freq: Once | INTRAVENOUS | Status: AC | PRN
  Administered 2022-10-18: 10 mL via INTRAVENOUS

## 2022-10-23 ENCOUNTER — Inpatient Hospital Stay: Payer: 59 | Attending: Hematology

## 2022-10-23 ENCOUNTER — Other Ambulatory Visit: Payer: Self-pay

## 2022-10-23 DIAGNOSIS — D696 Thrombocytopenia, unspecified: Secondary | ICD-10-CM | POA: Insufficient documentation

## 2022-10-23 DIAGNOSIS — D75839 Thrombocytosis, unspecified: Secondary | ICD-10-CM

## 2022-10-23 LAB — COMPREHENSIVE METABOLIC PANEL
ALT: 20 U/L (ref 0–44)
AST: 19 U/L (ref 15–41)
Albumin: 4.4 g/dL (ref 3.5–5.0)
Alkaline Phosphatase: 59 U/L (ref 38–126)
Anion gap: 6 (ref 5–15)
BUN: 14 mg/dL (ref 8–23)
CO2: 31 mmol/L (ref 22–32)
Calcium: 9.4 mg/dL (ref 8.9–10.3)
Chloride: 102 mmol/L (ref 98–111)
Creatinine, Ser: 1.16 mg/dL (ref 0.61–1.24)
GFR, Estimated: >60 mL/min (ref >60)
Glucose, Bld: 98 mg/dL (ref 70–99)
Potassium: 3.7 mmol/L (ref 3.5–5.1)
Sodium: 139 mmol/L (ref 135–145)
Total Bilirubin: 0.6 mg/dL (ref 0.3–1.2)
Total Protein: 7.4 g/dL (ref 6.5–8.1)

## 2022-10-23 LAB — CBC WITH DIFFERENTIAL/PLATELET
Abs Immature Granulocytes: 0.01 10*3/uL (ref 0.00–0.07)
Basophils Absolute: 0 10*3/uL (ref 0.0–0.1)
Basophils Relative: 0 %
Eosinophils Absolute: 0 10*3/uL (ref 0.0–0.5)
Eosinophils Relative: 1 %
HCT: 38.5 % — ABNORMAL LOW (ref 39.0–52.0)
Hemoglobin: 14 g/dL (ref 13.0–17.0)
Immature Granulocytes: 0 %
Lymphocytes Relative: 27 %
Lymphs Abs: 0.8 10*3/uL (ref 0.7–4.0)
MCH: 40.2 pg — ABNORMAL HIGH (ref 26.0–34.0)
MCHC: 36.4 g/dL — ABNORMAL HIGH (ref 30.0–36.0)
MCV: 110.6 fL — ABNORMAL HIGH (ref 80.0–100.0)
Monocytes Absolute: 0.3 10*3/uL (ref 0.1–1.0)
Monocytes Relative: 11 %
Neutro Abs: 1.8 10*3/uL (ref 1.7–7.7)
Neutrophils Relative %: 61 %
Platelets: 339 10*3/uL (ref 150–400)
RBC: 3.48 MIL/uL — ABNORMAL LOW (ref 4.22–5.81)
RDW: 11.7 % (ref 11.5–15.5)
WBC: 2.9 10*3/uL — ABNORMAL LOW (ref 4.0–10.5)
nRBC: 0 % (ref 0.0–0.2)

## 2022-10-31 DIAGNOSIS — R972 Elevated prostate specific antigen [PSA]: Secondary | ICD-10-CM

## 2022-10-31 HISTORY — DX: Elevated prostate specific antigen (PSA): R97.20

## 2022-11-01 ENCOUNTER — Other Ambulatory Visit (HOSPITAL_COMMUNITY): Payer: Self-pay

## 2022-11-01 ENCOUNTER — Telehealth: Payer: Self-pay

## 2022-11-01 MED ORDER — HYDROXYUREA 500 MG PO CAPS
1500.0000 mg | ORAL_CAPSULE | Freq: Every day | ORAL | 1 refills | Status: DC
  Filled 2022-11-01 – 2022-11-25 (×2): qty 82, 27d supply, fill #0
  Filled 2022-12-23: qty 82, 27d supply, fill #1

## 2022-11-01 NOTE — Telephone Encounter (Signed)
Spoke with pt via telephone to inform pt that he has mild leukopenia but his plts are overall controlled per Dr. Mosetta Putt.  Informed pt that Dr. Mosetta Putt wants to reduce is dose of Hydrea for day Monday & Thursday.  Informed pt that Dr. Mosetta Putt wants him to take 1,000mg  of Hydrea on Monday & Thursday and pt will take 1,500mg  of Hydrea on Sunday, Tuesday, Wednesday, Friday, and Saturday.  Stated that Dr. Mosetta Putt will have pt scheduled to have labs again in 4 to 8 wks.  Pt verbalized understanding and had no further questions or concerns at this time.

## 2022-11-25 ENCOUNTER — Other Ambulatory Visit (HOSPITAL_COMMUNITY): Payer: Self-pay

## 2022-12-06 ENCOUNTER — Telehealth: Payer: Self-pay

## 2022-12-06 NOTE — Telephone Encounter (Signed)
LVM for patient to return my call in reference to [facility] appointment on 6/14 with arrival time of 815

## 2022-12-18 ENCOUNTER — Telehealth: Payer: Self-pay

## 2022-12-18 NOTE — Telephone Encounter (Signed)
Received a call from patient stating he did receive my packet that I sent, he also stated that he has court the same day and wanted to know if there was a later or earlier appointment that day I explained the appointment to him and then he expressed he would just like to move forward with Radiation treatment and not surgery, I offered the appointment later in the month he declined and wanted to move on just Radiation, I told him I would get with the team and get him scheduled to just do Radiation

## 2022-12-19 ENCOUNTER — Encounter: Payer: Self-pay | Admitting: Radiation Oncology

## 2022-12-19 NOTE — Progress Notes (Signed)
GU Location of Tumor / Histology: Prostate Ca  If Prostate Cancer, Gleason Score is (3 + 4) and PSA is (4.16 on 08/2022)  Biopsies     10/18/2022 Dr. Modena Slater MR Prostate with/without Contrast CLINICAL DATA: Elevated PSA.   IMPRESSION: No radiographic evidence of high-grade prostate carcinoma. PI-RADS 2 (v.2.1): Low (clinically significant cancer unlikely).    Past/Anticipated interventions by urology, if any: NA  Past/Anticipated interventions by medical oncology, if any: NA  Weight changes, if any:  No  IPSS:  6 SHIM:  19  Bowel/Bladder complaints, if any:  No  Nausea/Vomiting, if any: No  Pain issues, if any:  0/10  SAFETY ISSUES: Prior radiation? No Pacemaker/ICD? No Possible current pregnancy? Male Is the patient on methotrexate? No  Current Complaints / other details:

## 2022-12-24 ENCOUNTER — Encounter: Payer: Self-pay | Admitting: Radiation Oncology

## 2022-12-24 ENCOUNTER — Ambulatory Visit
Admission: RE | Admit: 2022-12-24 | Discharge: 2022-12-24 | Disposition: A | Discharge status: Home / Self Care | Payer: 59 | Source: Ambulatory Visit | Attending: Radiation Oncology | Admitting: Radiation Oncology

## 2022-12-24 ENCOUNTER — Other Ambulatory Visit: Payer: Self-pay

## 2022-12-24 VITALS — BP 126/84 | HR 94 | Temp 97.5°F | Resp 18 | Ht 76.5 in | Wt 244.4 lb

## 2022-12-24 DIAGNOSIS — C61 Malignant neoplasm of prostate: Secondary | ICD-10-CM | POA: Diagnosis not present

## 2022-12-24 DIAGNOSIS — Z79624 Long term (current) use of inhibitors of nucleotide synthesis: Secondary | ICD-10-CM | POA: Insufficient documentation

## 2022-12-24 DIAGNOSIS — I10 Essential (primary) hypertension: Secondary | ICD-10-CM | POA: Insufficient documentation

## 2022-12-24 DIAGNOSIS — K449 Diaphragmatic hernia without obstruction or gangrene: Secondary | ICD-10-CM | POA: Insufficient documentation

## 2022-12-24 DIAGNOSIS — Z79899 Other long term (current) drug therapy: Secondary | ICD-10-CM | POA: Insufficient documentation

## 2022-12-24 DIAGNOSIS — Z803 Family history of malignant neoplasm of breast: Secondary | ICD-10-CM | POA: Diagnosis not present

## 2022-12-24 HISTORY — DX: Male erectile dysfunction, unspecified: N52.9

## 2022-12-24 NOTE — Progress Notes (Signed)
Introduced myself to the patient as the prostate nurse navigator.  No barriers to care identified at this time.  He is here to discuss his radiation treatment options.  I gave him my business card and asked him to call me with questions or concerns.  Verbalized understanding.  ?

## 2022-12-24 NOTE — Progress Notes (Signed)
Radiation Oncology         [phone] ________________________________  Initial Outpatient Consultation  Name: Mike Mullen MRN: [id number]  Date: 12/24/2022  DOB: May 21, 1961  HQ:IONGEXB, Provider, MD  [name], MD   REFERRING PHYSICIAN: [name], MD  DIAGNOSIS: 63 y.o. gentleman with Stage T1c adenocarcinoma of the prostate with Gleason score of 3+4, and PSA of 4.16.    ICD-10-CM   1. Malignant neoplasm of prostate (HCC)  C61       HISTORY OF PRESENT ILLNESS: Mike Mullen is a 62 y.o. male with a diagnosis of prostate cancer. He presented for evaluation in urology by Dr. Alvester Morin on 08/15/22 for a reported history of an elevated PSA that was initially detected on routine labs with his PCP while living in [place]. On relocating to [place], he established care with Dr. Alvester Morin and digital rectal examination was performed at that time, showing no nodules or concerning findings but PSA remained elevated at 4.16. This prompted prostate MRI which was performed on 10/18/22 showing no evidence of high-grade prostate carcinoma (PI-RADS 2) and no ECP or malignant lymphadenopathy in the pelvis. Given his family history of prostate cancer and persistently elevated PSA, he elected to proceed with transrectal ultrasound with 12 biopsies of the prostate, performed on 10/31/22.  The prostate volume measured 61.46 cc.  Out of 12 core biopsies, 4 were positive.  The maximum Gleason score was 3+4, and this was seen in the right mid and left base lateral. Additionally, small foci of Gleason 3+3 were seen in the left base and left mid.  The patient reviewed the biopsy results with his urologist and he has kindly been referred today for discussion of potential radiation treatment options. He was initially scheduled to meet in the multidisciplinary prostate conference on 12/27/22 but after talking with his PCP, decided that he was adamantly not interested in prostatectomy and preferred to proceed with radiation  oncology visit only.   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   ED (erectile dysfunction)    Elevated PSA 10/31/2022   Hypertension    Inguinal hernia    left   Urinary frequency 08/15/2022   Urinary urgency 08/15/2022   Weak urine stream 08/15/2022      PAST SURGICAL HISTORY: Past Surgical History:  Procedure Laterality Date   HERNIA REPAIR     INGUINAL HERNIA REPAIR Left 02/19/2016   Procedure: OPEN LEFT INGUINAL HERNIA REPAIR;  Surgeon: Jimmye Norman, MD;  Location: [facility];  Service: General;  Laterality: Left;   INSERTION OF MESH Left 02/19/2016   Procedure: INSERTION OF MESH;  Surgeon: Jimmye Norman, MD;  Location: [facility];  Service: General;  Laterality: Left;   PROSTATE BIOPSY      FAMILY HISTORY:  Family History  Problem Relation Age of Onset   Dementia Mother    Cancer Father 38       prostate cancer   Emphysema Father    Cancer Sister        breast cancer   Cancer Sister        breast cancer    SOCIAL HISTORY:  Social History   Socioeconomic History   Marital status: Single    Spouse name: Not on file   Number of children: 0   Years of education: Not on file   Highest education level: Not on file  Occupational History   Not on file  Tobacco Use   Smoking status: Never   Smokeless tobacco: Never  Vaping Use   Vaping Use: Never used  Substance and Sexual Activity   Alcohol use: Yes    Alcohol/week: 1.0 standard drink of alcohol    Types: 1 Cans of beer per week    Comment: social   Drug use: No   Sexual activity: Not on file  Other Topics Concern   Not on file  Social History Narrative   Not on file   Social Determinants of Health   Financial Resource Strain: Not on file  Food Insecurity: No Food Insecurity (12/24/2022)   Hunger Vital Sign    Worried About Running Out of Food in the Last  Year: Never true    Ran Out of Food in the Last Year: Never true  Transportation Needs: No Transportation Needs (12/24/2022)   PRAPARE - Administrator, Civil Service (Medical): No    Lack of Transportation (Non-Medical): No  Physical Activity: Not on file  Stress: Not on file  Social Connections: Not on file  Intimate Partner Violence: Not At Risk (12/24/2022)   Humiliation, Afraid, Rape, and Kick questionnaire    Fear of Current or Ex-Partner: No    Emotionally Abused: No    Physically Abused: No    Sexually Abused: No    ALLERGIES: Patient has no known allergies.  MEDICATIONS:  Current Outpatient Medications  Medication Sig Dispense Refill   amLODipine (NORVASC) 10 MG tablet Take 1 tablet every day by oral route, for HTN.     emtricitabine-tenofovir (TRUVADA) 200-300 MG tablet TAKE ONE TABLET BY MOUTH ONCE DAILY. STORE IN ORIGINAL CONTAINER AT ROOM TEMPERATURE.     hydrochlorothiazide (HYDRODIURIL) 25 MG tablet Take 1 tablet by mouth daily.     latanoprost (XALATAN) 0.005 % ophthalmic solution SMARTSIG:In Eye(s)     WEGOVY 0.25 MG/0.5ML SOAJ Inject 0.25 mg every week by subcutaneous route as directed.     hydroxyurea (HYDREA) 500 MG capsule Take 3 capsules (1,500mg  total) on Sunday, Tuesday, Wednesday, Friday, and Saturday.  Take 2 capsules (1,000mg  total) on Monday and Thursday. 82 capsule 1   sildenafil (VIAGRA) 100 MG tablet Take 100 mg by mouth daily.     Zoster Vaccine Adjuvanted [facility]) injection PHARMACY ADMINISTERED     No current facility-administered medications for this encounter.    REVIEW OF SYSTEMS:  On review of systems, the patient reports that he is doing well overall. He denies any chest pain, shortness of breath, cough, fevers, chills, night sweats, unintended weight changes. He denies any bowel disturbances, and denies abdominal pain, nausea or vomiting. He denies any new musculoskeletal or joint aches or pains. His IPSS was 6, indicating mild  urinary symptoms. His SHIM was 19, indicating he has mild erectile dysfunction. A complete review of systems is obtained and is otherwise negative.    PHYSICAL EXAM:  Wt Readings from Last 3 Encounters:  12/24/22 244 lb 6.4 oz (110.9 kg)  07/24/22 240 lb 4.8 oz (109 kg)  03/27/22 248 lb 11.2 oz (112.8 kg)   Temp Readings from Last 3 Encounters:  12/24/22 (!) 97.5 F (36.4 C)  07/24/22 97.9 F (36.6 C) (Oral)  03/27/22 98.1 F (36.7 C) (Oral)   BP Readings from Last 3 Encounters:  12/24/22 126/84  07/24/22 111/76  03/27/22 124/82   Pulse Readings from Last 3 Encounters:  12/24/22 94  07/24/22 92  03/27/22 84   Pain  Assessment Pain Score: 0-No pain/10  In general this is a well appearing African American male in no acute distress. He's alert and oriented x4 and appropriate throughout the examination. Cardiopulmonary assessment is negative for acute distress, and he exhibits normal effort.     KPS = 100  100 - Normal; no complaints; no evidence of disease. 90   - Able to carry on normal activity; minor signs or symptoms of disease. 80   - Normal activity with effort; some signs or symptoms of disease. 28   - Cares for self; unable to carry on normal activity or to do active work. 60   - Requires occasional assistance, but is able to care for most of his personal needs. 50   - Requires considerable assistance and frequent medical care. 40   - Disabled; requires special care and assistance. 30   - Severely disabled; hospital admission is indicated although death not imminent. 20   - Very sick; hospital admission necessary; active supportive treatment necessary. 10   - Moribund; fatal processes progressing rapidly. 0     - Dead  Karnofsky DA, Abelmann WH, Craver LS and Burchenal [facility] [phone]) The use of the nitrogen mustards in the palliative treatment of carcinoma: with particular reference to bronchogenic carcinoma Cancer 1 634-56  LABORATORY DATA:  Lab Results  Component  Value Date   WBC 2.9 (L) 10/23/2022   HGB 14.0 10/23/2022   HCT 38.5 (L) 10/23/2022   MCV 110.6 (H) 10/23/2022   PLT 339 10/23/2022   Lab Results  Component Value Date   NA 139 10/23/2022   K 3.7 10/23/2022   CL 102 10/23/2022   CO2 31 10/23/2022   Lab Results  Component Value Date   ALT 20 10/23/2022   AST 19 10/23/2022   ALKPHOS 59 10/23/2022   BILITOT 0.6 10/23/2022     RADIOGRAPHY: No results found.    IMPRESSION/PLAN: 1. 62 y.o. gentleman with Stage T1c adenocarcinoma of the prostate with Gleason Score of 3+4, and PSA of 4.16. We discussed the patient's workup and outlined the nature of prostate cancer in this setting. The patient's T stage, Gleason's score, and PSA put him into the favorable intermediate risk group. Accordingly, he is eligible for a variety of potential treatment options including brachytherapy, 5.5 weeks of external radiation, or prostatectomy. We discussed the available radiation techniques, and focused on the details and logistics of delivery. The patient may not be an ideal candidate for brachytherapy with a prostate volume of 62 gm prior to downsizing with a 5 alpha reductase inhibitor for at least 2-3 months to allow for downsizing of the prostate prior to initiating brachytherapy. We discussed and outlined the risks, benefits, short and long-term effects associated with radiotherapy and compared and contrasted these with prostatectomy. We discussed the role of SpaceOAR gel in reducing the rectal toxicity associated with radiotherapy.  He appears to have a good understanding of his disease and our treatment recommendations which are of curative intent.  He was encouraged to ask questions that were answered to his stated satisfaction.  At the conclusion of our conversation, the patient is interested in moving forward with brachytherapy and use of SpaceOAR gel to reduce rectal toxicity from radiotherapy.  We will share our discussion with Dr. Alvester Morin so that he can  get the patient started on finasteride now and we will move forward with scheduling his CT [facility] planning appointment in August 2024, to allow time for the prostate downsizing on finasteride.  The  patient met briefly with Darryl Nestle in our office who will be working closely with him to coordinate OR scheduling and pre and post procedure appointments.  We will contact the pharmaceutical rep to ensure that SpaceOAR is available at the time of procedure.  We enjoyed meeting him today and look forward to continuing to participate in his care. He knows that he is welcome to call at any time with any questions or concerns related to the treatment options discussed today.   We personally spent 70 minutes in this encounter including chart review, reviewing radiological studies, meeting face-to-face with the patient, entering orders and completing documentation.    Marguarite Arbour, PA-C    Margaretmary Dys, MD  [facility]  Radiation Oncology Direct Dial: [phone]  Fax: [phone] [email or web address]  Skype  [email or web address]   This document serves as a record of services personally performed by Margaretmary Dys, MD and Marcello Fennel, PA-C. It was created on their behalf by Mickie Bail, a trained medical scribe. The creation of this record is based on the scribe's personal observations and the provider's statements to them. This document has been checked and approved by the attending provider.

## 2022-12-25 ENCOUNTER — Other Ambulatory Visit: Payer: Self-pay | Admitting: Urology

## 2022-12-25 ENCOUNTER — Telehealth: Payer: Self-pay | Admitting: *Deleted

## 2022-12-25 NOTE — Telephone Encounter (Signed)
CALLED PATIENT TO INFORM OF PRE-SEED APPTS. AND IMPLANT DATE, SPOKE WITH PATIENT AND HE IS AWARE OF THESE APPTS. 

## 2022-12-27 ENCOUNTER — Ambulatory Visit: Payer: 59 | Admitting: Radiation Oncology

## 2022-12-27 NOTE — Progress Notes (Signed)
RN spoke with triage nurse at [facility] to review recommendations for patient to start finasteride.  Rx sent. RN notified patient.    Plan of care in progress.

## 2023-01-08 ENCOUNTER — Telehealth: Payer: Self-pay | Admitting: *Deleted

## 2023-01-08 NOTE — Progress Notes (Signed)
  Radiation Oncology         [phone] ________________________________  Name: Mike Mullen MRN: [id number]  Date: 01/09/2023  DOB: 11-19-1960  SIMULATION AND TREATMENT PLANNING NOTE PUBIC ARCH STUDY  NW:[id number], Provider, MD  [name], MD  DIAGNOSIS:  62 y.o. gentleman with Stage T1c adenocarcinoma of the prostate with Gleason score of 3+4, and PSA of 4.16.  Oncology History  Malignant neoplasm of prostate (HCC)  10/31/2022 Cancer Staging   Staging form: Prostate, AJCC 8th Edition - Clinical stage from 10/31/2022: Stage IIB (cT1c, cN0, cM0, PSA: 4.2, Grade Group: 2) - Signed by Marcello Fennel, PA-C on 12/24/2022 Histopathologic type: Adenocarcinoma, NOS Stage prefix: Initial diagnosis Prostate specific antigen (PSA) range: Less than 10 Gleason primary pattern: 3 Gleason secondary pattern: 4 Gleason score: 7 Histologic grading system: 5 grade system Number of biopsy cores examined: 12 Number of biopsy cores positive: 4 Location of positive needle core biopsies: Both sides   12/24/2022 Initial Diagnosis   Malignant neoplasm of prostate (HCC)       ICD-10-CM   1. Malignant neoplasm of prostate (HCC)  C61       COMPLEX SIMULATION:  The patient presented today for evaluation for possible prostate seed implant. He was brought to the radiation planning suite and placed supine on the CT couch. A 3-dimensional image study set was obtained in upload to the planning computer. There, on each axial slice, I contoured the prostate gland. Then, using three-dimensional radiation planning tools I reconstructed the prostate in view of the structures from the transperineal needle pathway to assess for possible pubic arch interference. In doing so, I did not appreciate any pubic arch interference. Also, the patient's prostate volume was estimated based on the drawn structure. The volume was *** cc.  Given the pubic arch appearance and prostate volume, patient remains a good candidate to  proceed with prostate seed implant. Today, he freely provided informed written consent to proceed.    PLAN: The patient will undergo prostate seed implant.   ________________________________  Artist Pais. Kathrynn Running, M.D.

## 2023-01-08 NOTE — Telephone Encounter (Signed)
CALLED PATIENT TO REMIND OF PRE-SEED APPTS. FOR 01-09-23, SPOKE WITH PATIENT AND HE IS AWARE OF THESE APPTS.

## 2023-01-09 ENCOUNTER — Other Ambulatory Visit: Payer: Self-pay

## 2023-01-09 ENCOUNTER — Ambulatory Visit
Admission: RE | Admit: 2023-01-09 | Discharge: 2023-01-09 | Disposition: A | Discharge status: Home / Self Care | Payer: 59 | Source: Ambulatory Visit | Attending: Radiation Oncology | Admitting: Radiation Oncology

## 2023-01-09 ENCOUNTER — Encounter (HOSPITAL_COMMUNITY)
Admission: RE | Admit: 2023-01-09 | Discharge: 2023-01-09 | Disposition: A | Discharge status: Home / Self Care | Payer: 59 | Source: Ambulatory Visit | Attending: Urology | Admitting: Urology

## 2023-01-09 ENCOUNTER — Encounter: Payer: Self-pay | Admitting: Urology

## 2023-01-09 ENCOUNTER — Ambulatory Visit
Admission: RE | Admit: 2023-01-09 | Discharge: 2023-01-09 | Disposition: A | Discharge status: Home / Self Care | Payer: 59 | Source: Ambulatory Visit | Attending: Urology | Admitting: Urology

## 2023-01-09 VITALS — Resp 19 | Ht 76.5 in | Wt 238.0 lb

## 2023-01-09 DIAGNOSIS — Z01818 Encounter for other preprocedural examination: Secondary | ICD-10-CM

## 2023-01-09 DIAGNOSIS — Z01810 Encounter for preprocedural cardiovascular examination: Secondary | ICD-10-CM | POA: Insufficient documentation

## 2023-01-09 DIAGNOSIS — C61 Malignant neoplasm of prostate: Secondary | ICD-10-CM

## 2023-01-09 NOTE — Progress Notes (Signed)
Radiation Oncology         [phone] ________________________________  Outpatient Follow up- Pre-seed visit  Name: Mike Mullen MRN: [id number]  Date: 01/09/2023  DOB: [birth year]  MV:HQIONG, Provider Not In  [name], MD   REFERRING PHYSICIAN: [name], MD  DIAGNOSIS: 62 y.o. gentleman with Stage T1c adenocarcinoma of the prostate with Gleason score of 3+4, and PSA of 4.16.     ICD-10-CM   1. Malignant neoplasm of prostate (HCC)  C61       HISTORY OF PRESENT ILLNESS: Mike Mullen is a 62 y.o. male with a diagnosis of prostate cancer. He presented for evaluation in urology by Dr. Alvester Morin on 08/15/22 for a reported history of an elevated PSA that was initially detected on routine labs with his PCP while living in [place]. On relocating to [place], he established care with Dr. Alvester Morin and digital rectal examination was performed at that time, showing no nodules or concerning findings but PSA remained elevated at 4.16. This prompted prostate MRI which was performed on 10/18/22 showing no evidence of high-grade prostate carcinoma (PI-RADS 2) and no ECP or malignant lymphadenopathy in the pelvis. Given his family history of prostate cancer and persistently elevated PSA, he elected to proceed with transrectal ultrasound with 12 biopsies of the prostate, performed on 10/31/22.  The prostate volume measured 61.46 cc.  Out of 12 core biopsies, 4 were positive.  The maximum Gleason score was 3+4, and this was seen in the right mid and left base lateral. Additionally, small foci of Gleason 3+3 were seen in the left base and left mid.   The patient reviewed the biopsy results with his urologist and was kindly referred to [place] for discussion of potential radiation treatment options. We initially met the patient on 12/24/22 and he was most interested in proceeding with brachytherapy and SpaceOAR gel placement for treatment of his disease. He was started on finasteride to downsize the prostate on 12/27/22 and  is here today for his pre-procedure imaging for planning and to answer any additional questions he may have about this treatment.   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   ED (erectile dysfunction)    Elevated PSA 10/31/2022   Hypertension    Inguinal hernia    left   Urinary frequency 08/15/2022   Urinary urgency 08/15/2022   Weak urine stream 08/15/2022      PAST SURGICAL HISTORY: Past Surgical History:  Procedure Laterality Date   HERNIA REPAIR     INGUINAL HERNIA REPAIR Left 02/19/2016   Procedure: OPEN LEFT INGUINAL HERNIA REPAIR;  Surgeon: Jimmye Norman, MD;  Location: [facility];  Service: General;  Laterality: Left;   INSERTION OF MESH Left 02/19/2016   Procedure: INSERTION OF MESH;  Surgeon: Jimmye Norman, MD;  Location: [facility];  Service: General;  Laterality: Left;   PROSTATE BIOPSY      FAMILY HISTORY:  Family History  Problem Relation Age of Onset   Dementia Mother    Cancer Father 29       prostate cancer   Emphysema Father    Cancer Sister        breast cancer   Cancer Sister        breast cancer    SOCIAL HISTORY:  Social History   Socioeconomic History   Marital status: Single    Spouse name:  Not on file   Number of children: 0   Years of education: Not on file   Highest education level: Not on file  Occupational History   Not on file  Tobacco Use   Smoking status: Never   Smokeless tobacco: Never  Vaping Use   Vaping Use: Never used  Substance and Sexual Activity   Alcohol use: Yes    Alcohol/week: 1.0 standard drink of alcohol    Types: 1 Cans of beer per week    Comment: social   Drug use: No   Sexual activity: Not on file  Other Topics Concern   Not on file  Social History Narrative   Not on file   Social Determinants of Health   Financial Resource Strain: Not on file  Food  Insecurity: No Food Insecurity (12/24/2022)   Hunger Vital Sign    Worried About Running Out of Food in the Last Year: Never true    Ran Out of Food in the Last Year: Never true  Transportation Needs: No Transportation Needs (12/24/2022)   PRAPARE - Administrator, Civil Service (Medical): No    Lack of Transportation (Non-Medical): No  Physical Activity: Not on file  Stress: Not on file  Social Connections: Not on file  Intimate Partner Violence: Not At Risk (12/24/2022)   Humiliation, Afraid, Rape, and Kick questionnaire    Fear of Current or Ex-Partner: No    Emotionally Abused: No    Physically Abused: No    Sexually Abused: No    ALLERGIES: Patient has no known allergies.  MEDICATIONS:  Current Outpatient Medications  Medication Sig Dispense Refill   amLODipine (NORVASC) 10 MG tablet Take 1 tablet every day by oral route, for HTN.     emtricitabine-tenofovir (TRUVADA) 200-300 MG tablet TAKE ONE TABLET BY MOUTH ONCE DAILY. STORE IN ORIGINAL CONTAINER AT ROOM TEMPERATURE.     hydrochlorothiazide (HYDRODIURIL) 25 MG tablet Take 1 tablet by mouth daily.     hydroxyurea (HYDREA) 500 MG capsule Take 3 capsules (1,500mg  total) on Sunday, Tuesday, Wednesday, Friday, and Saturday.  Take 2 capsules (1,000mg  total) on Monday and Thursday. 82 capsule 1   latanoprost (XALATAN) 0.005 % ophthalmic solution SMARTSIG:In Eye(s)     sildenafil (VIAGRA) 100 MG tablet Take 100 mg by mouth daily.     WEGOVY 0.25 MG/0.5ML SOAJ Inject 0.25 mg every week by subcutaneous route as directed.     Zoster Vaccine Adjuvanted [facility]) injection PHARMACY ADMINISTERED     No current facility-administered medications for this encounter.    REVIEW OF SYSTEMS:  On review of systems, the patient reports that he is doing well overall. He denies any chest pain, shortness of breath, cough, fevers, chills, night sweats, unintended weight changes. He denies any bowel disturbances, and denies abdominal pain,  nausea or vomiting. He denies any new musculoskeletal or joint aches or pains. His IPSS was 6, indicating mild urinary symptoms. His SHIM was 19, indicating he has mild erectile dysfunction. A complete review of systems is obtained and is otherwise negative.     PHYSICAL EXAM:  Wt Readings from Last 3 Encounters:  01/09/23 238 lb (108 kg)  12/24/22 244 lb 6.4 oz (110.9 kg)  07/24/22 240 lb 4.8 oz (109 kg)   Temp Readings from Last 3 Encounters:  12/24/22 (!) 97.5 F (36.4 C)  07/24/22 97.9 F (36.6 C) (Oral)  03/27/22 98.1 F (36.7 C) (Oral)   BP Readings from Last 3 Encounters:  12/24/22 126/84  07/24/22 111/76  03/27/22 124/82   Pulse Readings from Last 3 Encounters:  12/24/22 94  07/24/22 92  03/27/22 84   Pain Assessment Pain Score: 0-No pain/10  In general this is a well appearing African American male in no acute distress. He's alert and oriented x4 and appropriate throughout the examination. Cardiopulmonary assessment is negative for acute distress, and he exhibits normal effort.     KPS = 100  100 - Normal; no complaints; no evidence of disease. 90   - Able to carry on normal activity; minor signs or symptoms of disease. 80   - Normal activity with effort; some signs or symptoms of disease. 41   - Cares for self; unable to carry on normal activity or to do active work. 60   - Requires occasional assistance, but is able to care for most of his personal needs. 50   - Requires considerable assistance and frequent medical care. 40   - Disabled; requires special care and assistance. 30   - Severely disabled; hospital admission is indicated although death not imminent. 20   - Very sick; hospital admission necessary; active supportive treatment necessary. 10   - Moribund; fatal processes progressing rapidly. 0     - Dead  Karnofsky DA, Abelmann WH, Craver LS and Burchenal [facility] [phone]) The use of the nitrogen mustards in the palliative treatment of carcinoma: with  particular reference to bronchogenic carcinoma Cancer 1 634-56  LABORATORY DATA:  Lab Results  Component Value Date   WBC 2.9 (L) 10/23/2022   HGB 14.0 10/23/2022   HCT 38.5 (L) 10/23/2022   MCV 110.6 (H) 10/23/2022   PLT 339 10/23/2022   Lab Results  Component Value Date   NA 139 10/23/2022   K 3.7 10/23/2022   CL 102 10/23/2022   CO2 31 10/23/2022   Lab Results  Component Value Date   ALT 20 10/23/2022   AST 19 10/23/2022   ALKPHOS 59 10/23/2022   BILITOT 0.6 10/23/2022     RADIOGRAPHY: No results found.    IMPRESSION/PLAN: 1. 62 y.o. gentleman with Stage T1c adenocarcinoma of the prostate with Gleason score of 3+4, and PSA of 4.16.  The patient has elected to proceed with seed implant for treatment of his disease. We reviewed the risks, benefits, short and long-term effects associated with brachytherapy and discussed the role of SpaceOAR in reducing the rectal toxicity associated with radiotherapy.  He appears to have a good understanding of his disease and our treatment recommendations which are of curative intent.  He was encouraged to ask questions that were answered to his stated satisfaction. He has freely signed written consent to proceed today in the office and a copy of this document will be placed in his medical record. His procedure is tentatively scheduled for 03/07/23 in collaboration with Dr. Alvester Morin and we will see him back for his post-procedure visit approximately 3 weeks thereafter. We look forward to continuing to participate in his care. He knows that he is welcome to call with any questions or concerns at any time in the interim.  I personally spent 30 minutes in this encounter including chart review, reviewing radiological studies, meeting face-to-face with the patient, entering orders and completing documentation.    Marguarite Arbour, MMS, PA-C [place]  [facility] Radiation Oncology Physician Assistant Direct Dial: [phone]   Fax: [phone]

## 2023-01-09 NOTE — Progress Notes (Signed)
Pre-seed nursing interview for a diagnosis of Stage T1c adenocarcinoma of the prostate with Gleason score of 3+4, and PSA of 4.16.  Patient identity verified x2. Patient reports doing well. No issues conveyed at this time.  Meaningful use complete.  Urinary Management medication(s)- None currently Urology appointment date- 03/07/2023, with Dr. Alvester Morin at [facility] [place]  Resp 19   Ht 6' 4.5" (1.943 m)   Wt 238 lb (108 kg)   BMI 28.59 kg/m   This concludes the interaction.  Ruel Favors, LPN

## 2023-01-21 ENCOUNTER — Other Ambulatory Visit: Payer: Self-pay

## 2023-01-21 DIAGNOSIS — D473 Essential (hemorrhagic) thrombocythemia: Secondary | ICD-10-CM

## 2023-01-22 ENCOUNTER — Encounter: Payer: Self-pay | Admitting: Hematology

## 2023-01-22 ENCOUNTER — Other Ambulatory Visit: Payer: Self-pay

## 2023-01-22 ENCOUNTER — Inpatient Hospital Stay: Payer: 59 | Attending: Hematology

## 2023-01-22 ENCOUNTER — Inpatient Hospital Stay (HOSPITAL_BASED_OUTPATIENT_CLINIC_OR_DEPARTMENT_OTHER): Payer: 59 | Admitting: Hematology

## 2023-01-22 ENCOUNTER — Other Ambulatory Visit (HOSPITAL_COMMUNITY): Payer: Self-pay

## 2023-01-22 VITALS — BP 128/80 | HR 77 | Temp 98.3°F | Resp 18 | Ht 76.5 in | Wt 240.1 lb

## 2023-01-22 DIAGNOSIS — D473 Essential (hemorrhagic) thrombocythemia: Secondary | ICD-10-CM | POA: Insufficient documentation

## 2023-01-22 DIAGNOSIS — C61 Malignant neoplasm of prostate: Secondary | ICD-10-CM | POA: Diagnosis not present

## 2023-01-22 LAB — CBC WITH DIFFERENTIAL (CANCER CENTER ONLY)
Abs Immature Granulocytes: 0.01 10*3/uL (ref 0.00–0.07)
Basophils Absolute: 0 10*3/uL (ref 0.0–0.1)
Basophils Relative: 0 %
Eosinophils Absolute: 0 10*3/uL (ref 0.0–0.5)
Eosinophils Relative: 1 %
HCT: 38.6 % — ABNORMAL LOW (ref 39.0–52.0)
Hemoglobin: 13.8 g/dL (ref 13.0–17.0)
Immature Granulocytes: 0 %
Lymphocytes Relative: 26 %
Lymphs Abs: 0.6 10*3/uL — ABNORMAL LOW (ref 0.7–4.0)
MCH: 39.4 pg — ABNORMAL HIGH (ref 26.0–34.0)
MCHC: 35.8 g/dL (ref 30.0–36.0)
MCV: 110.3 fL — ABNORMAL HIGH (ref 80.0–100.0)
Monocytes Absolute: 0.3 10*3/uL (ref 0.1–1.0)
Monocytes Relative: 11 %
Neutro Abs: 1.5 10*3/uL — ABNORMAL LOW (ref 1.7–7.7)
Neutrophils Relative %: 62 %
Platelet Count: 315 10*3/uL (ref 150–400)
RBC: 3.5 MIL/uL — ABNORMAL LOW (ref 4.22–5.81)
RDW: 12.2 % (ref 11.5–15.5)
WBC Count: 2.5 10*3/uL — ABNORMAL LOW (ref 4.0–10.5)
nRBC: 0 % (ref 0.0–0.2)

## 2023-01-22 LAB — CMP (CANCER CENTER ONLY)
ALT: 18 U/L (ref 0–44)
AST: 19 U/L (ref 15–41)
Albumin: 4.1 g/dL (ref 3.5–5.0)
Alkaline Phosphatase: 53 U/L (ref 38–126)
Anion gap: 6 (ref 5–15)
BUN: 14 mg/dL (ref 8–23)
CO2: 26 mmol/L (ref 22–32)
Calcium: 9.1 mg/dL (ref 8.9–10.3)
Chloride: 105 mmol/L (ref 98–111)
Creatinine: 1.09 mg/dL (ref 0.61–1.24)
GFR, Estimated: >60 mL/min (ref >60)
Glucose, Bld: 89 mg/dL (ref 70–99)
Potassium: 3.9 mmol/L (ref 3.5–5.1)
Sodium: 137 mmol/L (ref 135–145)
Total Bilirubin: 0.5 mg/dL (ref 0.3–1.2)
Total Protein: 7.3 g/dL (ref 6.5–8.1)

## 2023-01-22 MED ORDER — HYDROXYUREA 500 MG PO CAPS
1500.0000 mg | ORAL_CAPSULE | Freq: Every day | ORAL | 1 refills | Status: DC
  Filled 2023-01-22: qty 70, 23d supply, fill #0
  Filled 2023-02-20: qty 70, 23d supply, fill #1

## 2023-01-22 NOTE — Progress Notes (Addendum)
[facility]   Telephone:(336) [phone] Fax:(336) [phone]   Clinic Follow up Note   Patient Care Team: System, Provider Not In as PCP - General Cherlyn Cushing, RN as Oncology Nurse Navigator  Date of Service:  01/22/2023  CHIEF COMPLAINT: f/u of thrombocytopenia   CURRENT THERAPY:  Hydrea, started 11/26/21, currently 1500mg  daily    ASSESSMENT:  Mike Mullen is a 62 y.o. male with   Essential thrombocythemia (HCC) -h/o isolated thrombocytosis since at least 2015, never had venous or arterial thrombosis. -reports he was previously evaluated in 2018 while living in [place], had a bone marrow biopsy around that time. On review of his limited provided records (from 08/2018 - 10/2021), he was prescribed Hydrea 500mg  on 12/21/19, but it's not clear if he ever took it. We do not have the results of his BMB; he will try to find these in his records. -presented to ED on 11/15/21 with dizziness. He was found to have platelet count of 927k, the rest of CBC was normal. Brain MRI was negative. -abdomen US on 11/26/21 was negative. 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, supporting diagnosis of MPN -he was started on hydrea on 11/26/21, dose went up to 1500mg  daily, due to moderate leukopenia will reduce the dose to 1000 mg daily for 2 days a week, and a 1500 mg daily for the rest of the week -Lab reviewed, he still has persistent mild leukopenia, and hematocrit is slightly low, will further reduce Hydrea to 1000 mg daily 4 days a week, and 1500 mg 2 days a week -lab in 1 and 3 months    Malignant neoplasm of prostate (HCC) Stage IIB, cT1cN0M0, with Gleason score 3+3= 6, baseline PSA 4.2 -Patient declined prostatectomy, he is scheduled to have brachytherapy by Dr. Berneice Heinrich next months. -Given the early stage disease and overall low risk disease, she does not need to systemic treatment.  He will follow-up with Dr. Alvester Morin for surveillance.    PLAN: -lab reviewed  -wbc-low Hydrea at reduce dose due to well controlled platelet count  lab in 4 weeks,and 2 months, lab/fu in 6 months  SUMMARY OF ONCOLOGIC HISTORY: Oncology History  Malignant neoplasm of prostate (HCC)  10/31/2022 Cancer Staging   Staging form: Prostate, AJCC 8th Edition - Clinical stage from 10/31/2022: Stage IIB (cT1c, cN0, cM0, PSA: 4.2, Grade Group: 2) - Signed by Marcello Fennel, PA-C on 12/24/2022 Histopathologic type: Adenocarcinoma, NOS Stage prefix: Initial diagnosis Prostate specific antigen (PSA) range: Less than 10 Gleason primary pattern: 3 Gleason secondary pattern: 4 Gleason score: 7 Histologic grading system: 5 grade system Number of biopsy cores examined: 12 Number of biopsy cores positive: 4 Location of positive needle core biopsies: Both sides   12/24/2022 Initial Diagnosis   Malignant neoplasm of prostate (HCC)      INTERVAL HISTORY:  Mike Mullen is here for a follow up of thrombocytopenia. He was last seen by me  on 07/24/2022. He presents to the clinic alone. Pt state that he is doing well. Pt report that he was diagnose with Prostate cancer and he's going to have radiation. Pt has no issues with Hydrea.      All other systems were reviewed with the patient and are negative.  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   ED (erectile dysfunction)    Elevated PSA 10/31/2022   Hypertension    Inguinal hernia    left  Urinary frequency 08/15/2022   Urinary urgency 08/15/2022   Weak urine stream 08/15/2022    SURGICAL HISTORY: Past Surgical History:  Procedure Laterality Date   HERNIA REPAIR     INGUINAL HERNIA REPAIR Left 02/19/2016   Procedure: OPEN LEFT INGUINAL HERNIA REPAIR;  Surgeon: Jimmye Norman, MD;  Location: [facility];  Service: General;  Laterality: Left;   INSERTION OF MESH Left 02/19/2016   Procedure: INSERTION OF MESH;  Surgeon: Jimmye Norman,  MD;  Location: [facility];  Service: General;  Laterality: Left;   PROSTATE BIOPSY      I have reviewed the social history and family history with the patient and they are unchanged from previous note.  ALLERGIES:  has No Known Allergies.  MEDICATIONS:  Current Outpatient Medications  Medication Sig Dispense Refill   amLODipine (NORVASC) 10 MG tablet Take 1 tablet every day by oral route, for HTN.     emtricitabine-tenofovir (TRUVADA) 200-300 MG tablet TAKE ONE TABLET BY MOUTH ONCE DAILY. STORE IN ORIGINAL CONTAINER AT ROOM TEMPERATURE.     hydrochlorothiazide (HYDRODIURIL) 25 MG tablet Take 1 tablet by mouth daily.     hydroxyurea (HYDREA) 500 MG capsule Take 2 capsules daily on Tuesday, Thursday, Saturday and Sunday and 3 capsules daily for the rest of week 70 capsule 1   latanoprost (XALATAN) 0.005 % ophthalmic solution SMARTSIG:In Eye(s)     sildenafil (VIAGRA) 100 MG tablet Take 100 mg by mouth daily.     WEGOVY 0.25 MG/0.5ML SOAJ Inject 0.25 mg every week by subcutaneous route as directed.     Zoster Vaccine Adjuvanted [facility]) injection PHARMACY ADMINISTERED     No current facility-administered medications for this visit.    PHYSICAL EXAMINATION: ECOG PERFORMANCE STATUS: 0 - Asymptomatic  Vitals:   01/22/23 1123  BP: 128/80  Pulse: 77  Resp: 18  Temp: 98.3 F (36.8 C)  SpO2: 99%   Wt Readings from Last 3 Encounters:  01/22/23 240 lb 1.6 oz (108.9 kg)  01/09/23 238 lb (108 kg)  12/24/22 244 lb 6.4 oz (110.9 kg)     GENERAL:alert, no distress and comfortable SKIN: skin color normal, no rashes or significant lesions EYES: normal, Conjunctiva are pink and non-injected, sclera clear  NEURO: alert & oriented x 3 with fluent speech  LABORATORY DATA:  I have reviewed the data as listed    Latest Ref Rng & Units 01/22/2023   10:30 AM 10/23/2022    9:31 AM 07/24/2022    9:24 AM  CBC  WBC 4.0 - 10.5 K/uL 2.5  2.9  4.4   Hemoglobin 13.0 - 17.0 g/dL 56.2   13.0  86.5   Hematocrit 39.0 - 52.0 % 38.6  38.5  38.5   Platelets 150 - 400 K/uL 315  339  442         Latest Ref Rng & Units 01/22/2023   10:30 AM 10/23/2022    9:31 AM 07/24/2022    9:24 AM  CMP  Glucose 70 - 99 mg/dL 89  98  94   BUN 8 - 23 mg/dL 14  14  13    Creatinine 0.61 - 1.24 mg/dL 7.84  6.96  2.95   Sodium 135 - 145 mmol/L 137  139  138   Potassium 3.5 - 5.1 mmol/L 3.9  3.7  4.0   Chloride 98 - 111 mmol/L 105  102  103   CO2 22 - 32 mmol/L 26  31  31    Calcium 8.9 -  10.3 mg/dL 9.1  9.4  9.5   Total Protein 6.5 - 8.1 g/dL 7.3  7.4  7.5   Total Bilirubin 0.3 - 1.2 mg/dL 0.5  0.6  0.5   Alkaline Phos 38 - 126 U/L 53  59  60   AST 15 - 41 U/L 19  19  18    ALT 0 - 44 U/L 18  20  23        RADIOGRAPHIC STUDIES: I have personally reviewed the radiological images as listed and agreed with the findings in the report. No results found.    No orders of the defined types were placed in this encounter.  All questions were answered. The patient knows to call the clinic with any problems, questions or concerns. No barriers to learning was detected. The total time spent in the appointment was 25 minutes.     Malachy Mood, MD 01/22/2023   Carolin Coy, CMA, am acting as scribe for Malachy Mood, MD.   I have reviewed the above documentation for accuracy and completeness, and I agree with the above.

## 2023-01-22 NOTE — Assessment & Plan Note (Signed)
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 -reports he was previously evaluated in 2018 while living in [place], had a bone marrow biopsy around that time. On review of his limited provided records (from 08/2018 - 10/2021), he was prescribed Hydrea 500mg  on 12/21/19, but it's not clear if he ever took it. We do not have the results of his BMB; he will try to find these in his records. -presented to ED on 11/15/21 with dizziness. He was found to have platelet count of 927k, the rest of CBC was normal. Brain MRI was negative. -abdomen US on 11/26/21 was negative. 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 -Lab reviewed, mild leukopenia has resolved, platelet count 442K today, well controlled  -Continue Hydrea at the same dose

## 2023-01-22 NOTE — Assessment & Plan Note (Addendum)
Stage IIB, cT1cN0M0, with Gleason score 3+3= 6, baseline PSA 4.2 -Patient declined prostatectomy, he is scheduled to have brachytherapy by Dr. Berneice Heinrich next months.

## 2023-02-04 ENCOUNTER — Other Ambulatory Visit (HOSPITAL_COMMUNITY): Payer: Self-pay

## 2023-02-04 ENCOUNTER — Other Ambulatory Visit: Payer: Self-pay

## 2023-02-04 MED ORDER — SEMAGLUTIDE-WEIGHT MANAGEMENT 0.5 MG/0.5ML ~~LOC~~ SOAJ
0.5000 mg | SUBCUTANEOUS | 0 refills | Status: DC
  Filled 2023-02-04: qty 2, 28d supply, fill #0

## 2023-02-04 MED ORDER — WEGOVY 0.5 MG/0.5ML ~~LOC~~ SOAJ: SUBCUTANEOUS | 0 refills | Status: DC

## 2023-02-07 ENCOUNTER — Inpatient Hospital Stay: Payer: 59 | Admitting: General Practice

## 2023-02-07 NOTE — Progress Notes (Signed)
[facility] [place] Spiritual Care Note  Assisted Mr Isho with completing his Health Care Power of Attorney and Living Will at [facility] Advance Directives Clinic today.  He has selected Rozell Searing, Montez Hageman of [place] [phone]) and Gweneth Fritter [place] of [place] [phone]) to be his health care agents. His special instructions note that he desires cremation. Mr Bissey does NOT desire life-prolonging measures but DOES want artificial nutrition and hydration. In the event of differing instructions, he desires that his team follow the Living Will.  Copies and original to Mr Jasin; copy to Health Information Management to be scanned into Electronic Medical Record.   [address] [place], [place], [facility] Pager [phone] Voicemail [phone]

## 2023-02-19 ENCOUNTER — Other Ambulatory Visit: Payer: Self-pay

## 2023-02-19 ENCOUNTER — Inpatient Hospital Stay: Payer: 59 | Attending: Hematology

## 2023-02-19 DIAGNOSIS — D473 Essential (hemorrhagic) thrombocythemia: Secondary | ICD-10-CM | POA: Insufficient documentation

## 2023-02-19 LAB — CBC WITH DIFFERENTIAL (CANCER CENTER ONLY)
Abs Immature Granulocytes: 0.02 10*3/uL (ref 0.00–0.07)
Basophils Absolute: 0 10*3/uL (ref 0.0–0.1)
Basophils Relative: 0 %
Eosinophils Absolute: 0 10*3/uL (ref 0.0–0.5)
Eosinophils Relative: 0 %
HCT: 40 % (ref 39.0–52.0)
Hemoglobin: 14.2 g/dL (ref 13.0–17.0)
Immature Granulocytes: 1 %
Lymphocytes Relative: 25 %
Lymphs Abs: 0.6 10*3/uL — ABNORMAL LOW (ref 0.7–4.0)
MCH: 39.2 pg — ABNORMAL HIGH (ref 26.0–34.0)
MCHC: 35.5 g/dL (ref 30.0–36.0)
MCV: 110.5 fL — ABNORMAL HIGH (ref 80.0–100.0)
Monocytes Absolute: 0.3 10*3/uL (ref 0.1–1.0)
Monocytes Relative: 12 %
Neutro Abs: 1.6 10*3/uL — ABNORMAL LOW (ref 1.7–7.7)
Neutrophils Relative %: 62 %
Platelet Count: 421 10*3/uL — ABNORMAL HIGH (ref 150–400)
RBC: 3.62 MIL/uL — ABNORMAL LOW (ref 4.22–5.81)
RDW: 11.9 % (ref 11.5–15.5)
WBC Count: 2.6 10*3/uL — ABNORMAL LOW (ref 4.0–10.5)
nRBC: 0 % (ref 0.0–0.2)

## 2023-02-19 LAB — CMP (CANCER CENTER ONLY)
ALT: 16 U/L (ref 0–44)
AST: 17 U/L (ref 15–41)
Albumin: 4.5 g/dL (ref 3.5–5.0)
Alkaline Phosphatase: 60 U/L (ref 38–126)
Anion gap: 8 (ref 5–15)
BUN: 11 mg/dL (ref 8–23)
CO2: 29 mmol/L (ref 22–32)
Calcium: 9 mg/dL (ref 8.9–10.3)
Chloride: 102 mmol/L (ref 98–111)
Creatinine: 1.13 mg/dL (ref 0.61–1.24)
GFR, Estimated: >60 mL/min (ref >60)
Glucose, Bld: 101 mg/dL — ABNORMAL HIGH (ref 70–99)
Potassium: 3.5 mmol/L (ref 3.5–5.1)
Sodium: 139 mmol/L (ref 135–145)
Total Bilirubin: 0.5 mg/dL (ref 0.3–1.2)
Total Protein: 7.5 g/dL (ref 6.5–8.1)

## 2023-02-28 ENCOUNTER — Encounter (HOSPITAL_BASED_OUTPATIENT_CLINIC_OR_DEPARTMENT_OTHER): Payer: Self-pay | Admitting: Urology

## 2023-02-28 ENCOUNTER — Other Ambulatory Visit: Payer: Self-pay

## 2023-02-28 NOTE — Progress Notes (Addendum)
Spoke w/ via phone for pre-op interview---pt Lab needs dos---- none              Lab results------EKG 01-09-2023, cbc cmp 02-19-2023 epic, oncology lov dr Mosetta Putt 01-22-2023 epic COVID test -----patient states asymptomatic no test needed Arrive at -------845 am 03-07-2023 NPO after MN NO Solid Food.  Clear liquids from MN until---745 Med rec completed Medications to take morning of surgery -----amlodipine, truvada, eye drop Diabetic medication -----last dose of semaglutide 02-25-2023 (takes for weight loss) Patient instructed no nail polish to be worn day of surgery Patient instructed to bring photo id and insurance card day of surgery Patient aware to have Driver (ride ) / caregiver   sister b j Huser   for 24 hours after surgery  Patient Special Instructions -----none Pre-Op special Instructions -----none Patient verbalized understanding of instructions that were given at this phone interview. Patient denies shortness of breath, chest pain, fever, cough at this phone interview.

## 2023-02-28 NOTE — Progress Notes (Signed)
RN left message with direct dial if he has any questions prior to upcoming brachytherapy.

## 2023-03-01 IMAGING — MR MR HEAD W/O CM
10 series · 48 of 48 positions shown · non-contrast
Comparison: None Available.

CLINICAL DATA: Neuro deficit, acute, stroke suspected

EXAM:
MRI HEAD WITHOUT CONTRAST
TECHNIQUE: Multiplanar, multiecho pulse sequences of the brain and surrounding
structures were obtained without intravenous contrast.

[Series 5: DWI · axial · 3.0mm · 1.42mm/px · z∈[-42,+146]mm · 10 of 128 slices shown (1 of 2)]
[im 1/128]
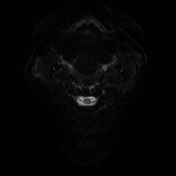
[im 15/128]
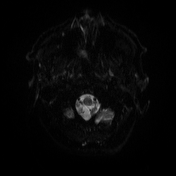
[im 29/128]
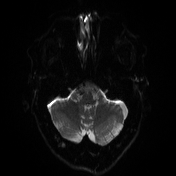
[im 43/128]
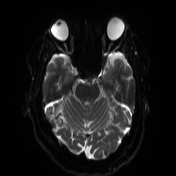
[im 57/128]
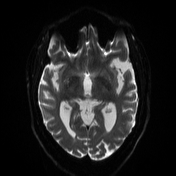
[im 71/128]
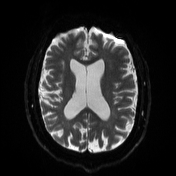
[im 85/128]
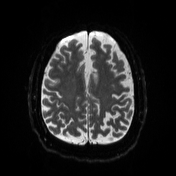
[im 99/128]
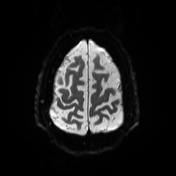
[im 113/128]
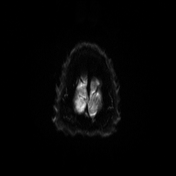
[im 128/128]
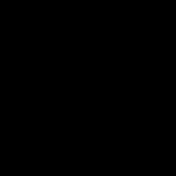

[Series 6: DWI · axial · 3.0mm · 1.42mm/px · z∈[-42,+140]mm · 4 of 62 slices shown (2 of 2)]
[im 1/62]
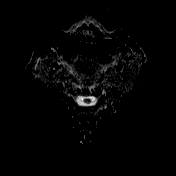
[im 21/62]
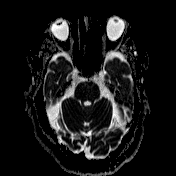
[im 41/62]
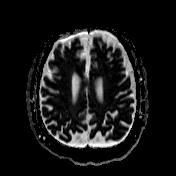
[im 62/62]
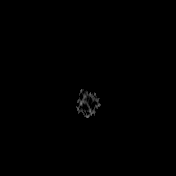

[Series 7: T1 · sagittal · 5.0mm · 0.78mm/px · 2 of 28 slices shown (1 of 2)]
[im 1/28]
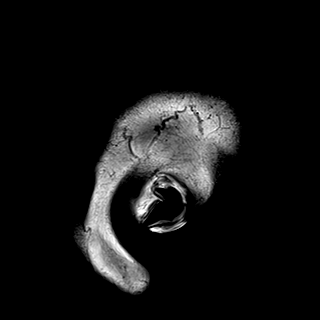
[im 28/28]
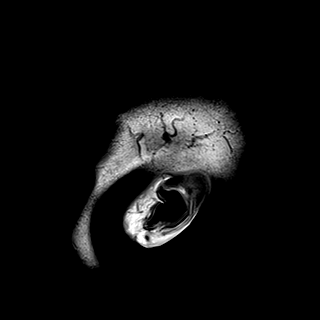

[Series 8: T2 · axial · 5.0mm · 0.65mm/px · z∈[-34,+147]mm · 2 of 29 slices shown (1 of 2)]
[im 1/29]
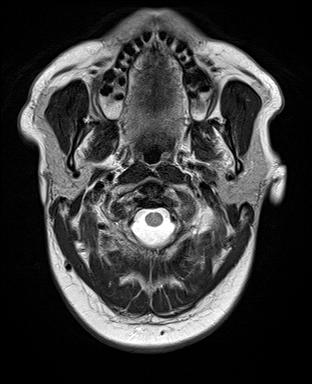
[im 29/29]
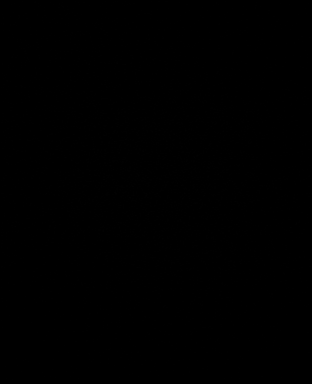

[Series 9: swi_images · axial · 3.0mm · 0.75mm/px · z∈[-31,+145]mm · 4 of 60 slices shown]
[im 1/60]
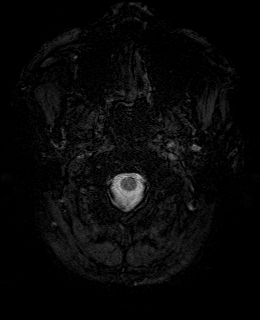
[im 20/60]
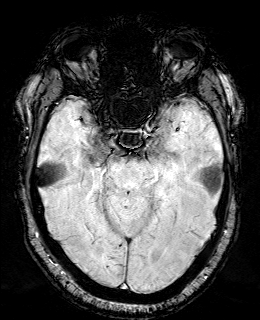
[im 40/60]
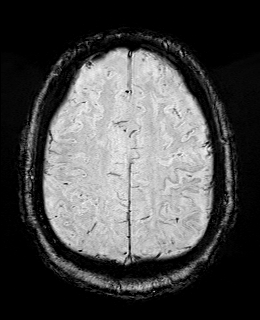
[im 60/60]
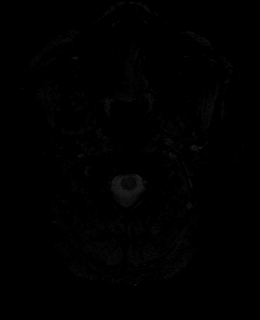

[Series 11: FLAIR · axial · 3.0mm · 0.78mm/px · z∈[-30,+143]mm · 4 of 59 slices shown]
[im 1/59]
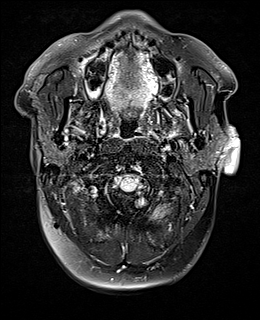
[im 20/59]
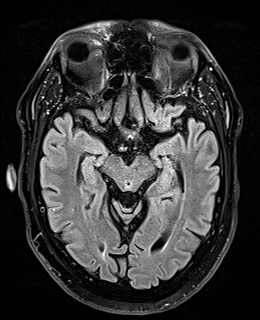
[im 39/59]
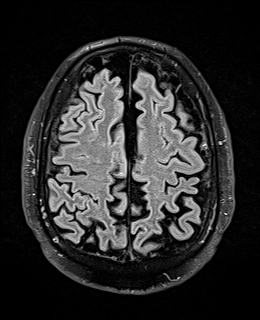
[im 59/59]
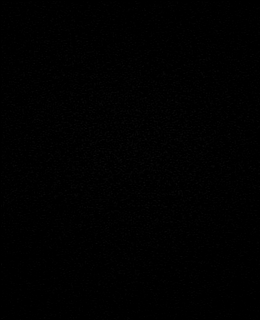

[Series 12: T1 · axial · 1.0mm · 0.98mm/px · z∈[-33,+141]mm · 13 of 176 slices shown (2 of 2)]
[im 1/176]
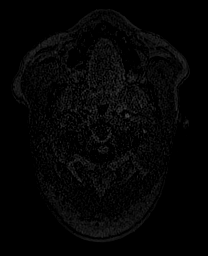
[im 15/176]
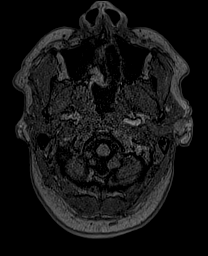
[im 30/176]
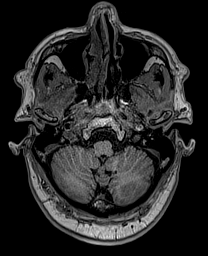
[im 44/176]
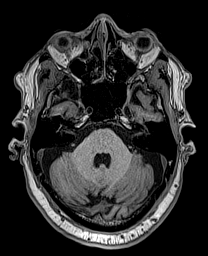
[im 59/176]
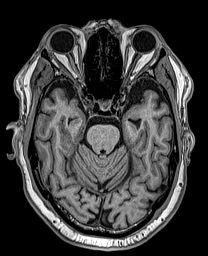
[im 73/176]
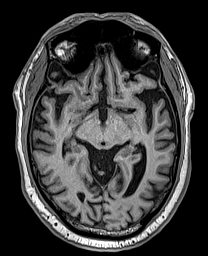
[im 88/176]
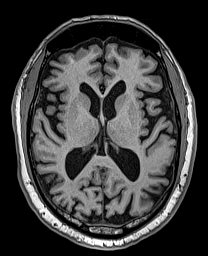
[im 103/176]
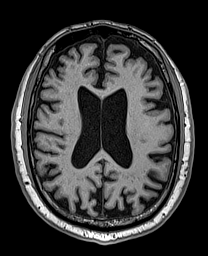
[im 117/176]
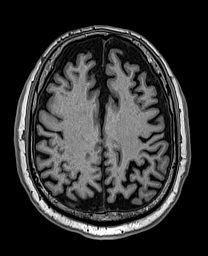
[im 132/176]
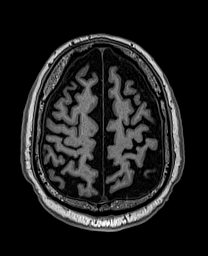
[im 146/176]
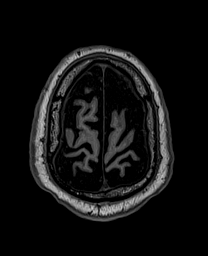
[im 161/176]
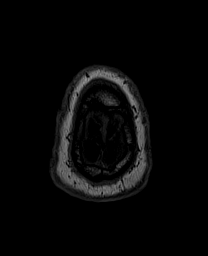
[im 176/176]
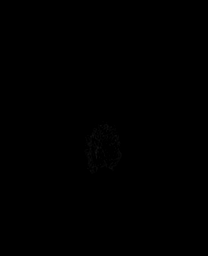

[Series 13: cor dwi_tracew · coronal · 5.0mm · 1.53mm/px · 4 of 62 slices shown]
[im 1/62]
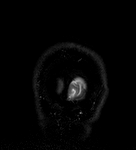
[im 21/62]
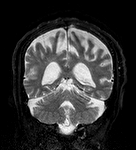
[im 41/62]
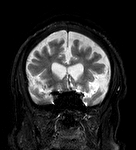
[im 62/62]
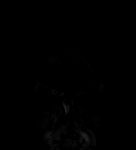

[Series 14: cor dwi_adc · coronal · 5.0mm · 1.53mm/px · 2 of 31 slices shown]
[im 1/31]
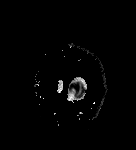
[im 31/31]
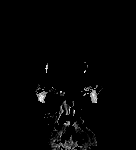

[Series 15: T2 · coronal · 5.0mm · 0.57mm/px · 3 of 40 slices shown (2 of 2)]
[im 1/40]
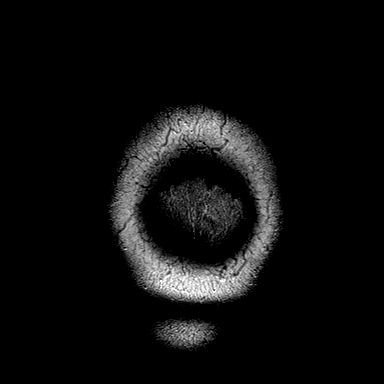
[im 20/40]
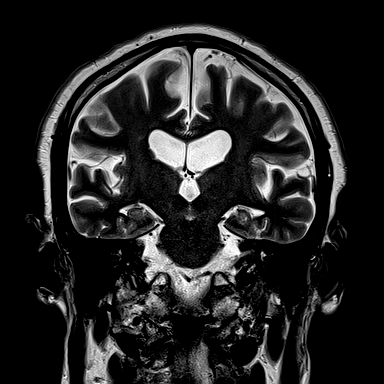
[im 40/40]
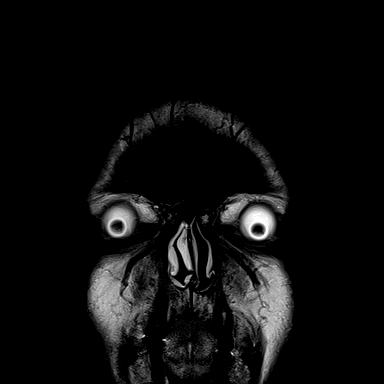

[48 of 48 positions shown; findings below may reference images not displayed]

FINDINGS: Brain: No acute infarction, hemorrhage, hydrocephalus, extra-axial
collection or mass lesion. Cerebral atrophy.

Vascular: Major arterial flow voids are maintained at the skull
base.

Skull and upper cervical spine: Normal marrow signal.

Sinuses/Orbits: Clear sinuses.  No acute orbital findings.

Other: No mastoid effusions.
IMPRESSION: 1. No evidence of acute intracranial abnormality.
2.  Cerebral atrophy ([id number]).

## 2023-03-06 ENCOUNTER — Telehealth: Payer: Self-pay | Admitting: *Deleted

## 2023-03-06 NOTE — Telephone Encounter (Signed)
CALLED PATIENT TO REMIND OF PROCEDURE FOR 03-07-23, SPOKE WITH PATIENT AND HE IS AWARE OF THIS PROCEDURE

## 2023-03-07 ENCOUNTER — Encounter (HOSPITAL_BASED_OUTPATIENT_CLINIC_OR_DEPARTMENT_OTHER): Payer: Self-pay | Admitting: Urology

## 2023-03-07 ENCOUNTER — Ambulatory Visit (HOSPITAL_BASED_OUTPATIENT_CLINIC_OR_DEPARTMENT_OTHER): Payer: 59

## 2023-03-07 ENCOUNTER — Other Ambulatory Visit (HOSPITAL_COMMUNITY): Payer: Self-pay

## 2023-03-07 ENCOUNTER — Ambulatory Visit (HOSPITAL_COMMUNITY): Payer: 59

## 2023-03-07 ENCOUNTER — Other Ambulatory Visit: Payer: Self-pay

## 2023-03-07 ENCOUNTER — Encounter (HOSPITAL_BASED_OUTPATIENT_CLINIC_OR_DEPARTMENT_OTHER)
Admission: RE | Disposition: A | Discharge status: Home / Self Care | Payer: Self-pay | Source: Ambulatory Visit | Attending: Urology

## 2023-03-07 ENCOUNTER — Ambulatory Visit (HOSPITAL_BASED_OUTPATIENT_CLINIC_OR_DEPARTMENT_OTHER)
Admission: RE | Admit: 2023-03-07 | Discharge: 2023-03-07 | Disposition: A | Discharge status: Home / Self Care | Payer: 59 | Source: Ambulatory Visit | Attending: Urology | Admitting: Urology

## 2023-03-07 DIAGNOSIS — Z8042 Family history of malignant neoplasm of prostate: Secondary | ICD-10-CM | POA: Diagnosis not present

## 2023-03-07 DIAGNOSIS — C61 Malignant neoplasm of prostate: Secondary | ICD-10-CM

## 2023-03-07 DIAGNOSIS — N529 Male erectile dysfunction, unspecified: Secondary | ICD-10-CM | POA: Insufficient documentation

## 2023-03-07 DIAGNOSIS — Z79899 Other long term (current) drug therapy: Secondary | ICD-10-CM | POA: Diagnosis not present

## 2023-03-07 DIAGNOSIS — I10 Essential (primary) hypertension: Secondary | ICD-10-CM

## 2023-03-07 DIAGNOSIS — Z01818 Encounter for other preprocedural examination: Secondary | ICD-10-CM

## 2023-03-07 HISTORY — PX: SPACE OAR INSTILLATION: SHX6769

## 2023-03-07 HISTORY — DX: Malignant neoplasm of prostate: C61

## 2023-03-07 HISTORY — PX: RADIOACTIVE SEED IMPLANT: SHX5150

## 2023-03-07 HISTORY — DX: Thrombocytopenia, unspecified: D69.6

## 2023-03-07 LAB — POCT I-STAT, CHEM 8
BUN: 10 mg/dL (ref 8–23)
Calcium, Ion: 1.19 mmol/L (ref 1.15–1.40)
Chloride: 103 mmol/L (ref 98–111)
Creatinine, Ser: 1.1 mg/dL (ref 0.61–1.24)
Glucose, Bld: 100 mg/dL — ABNORMAL HIGH (ref 70–99)
HCT: 42 % (ref 39.0–52.0)
Hemoglobin: 14.3 g/dL (ref 13.0–17.0)
Potassium: 3.5 mmol/L (ref 3.5–5.1)
Sodium: 140 mmol/L (ref 135–145)
TCO2: 23 mmol/L (ref 22–32)

## 2023-03-07 SURGERY — INSERTION, RADIATION SOURCE, PROSTATE
Anesthesia: General | Site: Prostate

## 2023-03-07 MED ORDER — DROPERIDOL 2.5 MG/ML IJ SOLN: 0.6250 mg | Freq: Once | INTRAMUSCULAR | Status: DC | PRN

## 2023-03-07 MED ORDER — FENTANYL CITRATE (PF) 100 MCG/2ML IJ SOLN
INTRAMUSCULAR | Status: AC
  Filled 2023-03-07: qty 2

## 2023-03-07 MED ORDER — MIDAZOLAM HCL 2 MG/2ML IJ SOLN
INTRAMUSCULAR | Status: AC
  Filled 2023-03-07: qty 2

## 2023-03-07 MED ORDER — PROPOFOL 10 MG/ML IV BOLUS
INTRAVENOUS | Status: DC | PRN
Start: 2023-03-07 — End: 2023-03-07
  Administered 2023-03-07: 200 mg via INTRAVENOUS
  Administered 2023-03-07: 50 mg via INTRAVENOUS

## 2023-03-07 MED ORDER — MIDAZOLAM HCL 5 MG/5ML IJ SOLN
INTRAMUSCULAR | Status: DC | PRN
  Administered 2023-03-07: 2 mg via INTRAVENOUS

## 2023-03-07 MED ORDER — FENTANYL CITRATE (PF) 100 MCG/2ML IJ SOLN
INTRAMUSCULAR | Status: DC | PRN
  Administered 2023-03-07: 50 ug via INTRAVENOUS
  Administered 2023-03-07 (×2): 25 ug via INTRAVENOUS

## 2023-03-07 MED ORDER — ONDANSETRON HCL 4 MG/2ML IJ SOLN
INTRAMUSCULAR | Status: AC
  Filled 2023-03-07: qty 2

## 2023-03-07 MED ORDER — DEXAMETHASONE SODIUM PHOSPHATE 10 MG/ML IJ SOLN
INTRAMUSCULAR | Status: DC | PRN
  Administered 2023-03-07: 10 mg via INTRAVENOUS

## 2023-03-07 MED ORDER — KETOROLAC TROMETHAMINE 30 MG/ML IJ SOLN
INTRAMUSCULAR | Status: AC
  Filled 2023-03-07: qty 1

## 2023-03-07 MED ORDER — CIPROFLOXACIN IN D5W 400 MG/200ML IV SOLN
400.0000 mg | INTRAVENOUS | Status: AC
  Administered 2023-03-07: 400 mg via INTRAVENOUS

## 2023-03-07 MED ORDER — SODIUM CHLORIDE 0.9 % IR SOLN
Status: DC | PRN
  Administered 2023-03-07: 1000 mL

## 2023-03-07 MED ORDER — OXYCODONE HCL 5 MG/5ML PO SOLN: 5.0000 mg | Freq: Once | ORAL | Status: DC | PRN

## 2023-03-07 MED ORDER — ROCURONIUM BROMIDE 10 MG/ML (PF) SYRINGE
PREFILLED_SYRINGE | INTRAVENOUS | Status: DC | PRN
  Administered 2023-03-07: 60 mg via INTRAVENOUS
  Administered 2023-03-07: 20 mg via INTRAVENOUS

## 2023-03-07 MED ORDER — IOHEXOL 300 MG/ML  SOLN
INTRAMUSCULAR | Status: DC | PRN
  Administered 2023-03-07: 3 mL

## 2023-03-07 MED ORDER — KETOROLAC TROMETHAMINE 30 MG/ML IJ SOLN
INTRAMUSCULAR | Status: DC | PRN
  Administered 2023-03-07: 30 mg via INTRAVENOUS

## 2023-03-07 MED ORDER — SODIUM CHLORIDE (PF) 0.9 % IJ SOLN
INTRAMUSCULAR | Status: DC | PRN
  Administered 2023-03-07: 10 mL

## 2023-03-07 MED ORDER — ROCURONIUM BROMIDE 10 MG/ML (PF) SYRINGE
PREFILLED_SYRINGE | INTRAVENOUS | Status: AC
  Filled 2023-03-07: qty 10

## 2023-03-07 MED ORDER — ONDANSETRON HCL 4 MG/2ML IJ SOLN
INTRAMUSCULAR | Status: DC | PRN
  Administered 2023-03-07: 4 mg via INTRAVENOUS

## 2023-03-07 MED ORDER — HYDROCODONE-ACETAMINOPHEN 5-325 MG PO TABS
1.0000 | ORAL_TABLET | ORAL | 0 refills | Status: DC | PRN
Start: 2023-03-07 — End: 2023-04-21
  Filled 2023-03-07: qty 10, 2d supply, fill #0

## 2023-03-07 MED ORDER — OXYCODONE HCL 5 MG PO TABS: 5.0000 mg | ORAL_TABLET | Freq: Once | ORAL | Status: DC | PRN

## 2023-03-07 MED ORDER — CIPROFLOXACIN IN D5W 400 MG/200ML IV SOLN
INTRAVENOUS | Status: AC
  Filled 2023-03-07: qty 200

## 2023-03-07 MED ORDER — LACTATED RINGERS IV SOLN: INTRAVENOUS | Status: DC

## 2023-03-07 MED ORDER — LIDOCAINE HCL (PF) 2 % IJ SOLN
INTRAMUSCULAR | Status: AC
  Filled 2023-03-07: qty 5

## 2023-03-07 MED ORDER — LIDOCAINE 2% (20 MG/ML) 5 ML SYRINGE
INTRAMUSCULAR | Status: DC | PRN
  Administered 2023-03-07: 100 mg via INTRAVENOUS

## 2023-03-07 MED ORDER — STERILE WATER FOR IRRIGATION IR SOLN
Status: DC | PRN
  Administered 2023-03-07: 7 mL

## 2023-03-07 MED ORDER — FLEET ENEMA 7-19 GM/118ML RE ENEM: 1.0000 | ENEMA | Freq: Once | RECTAL | Status: DC

## 2023-03-07 MED ORDER — DEXAMETHASONE SODIUM PHOSPHATE 10 MG/ML IJ SOLN
INTRAMUSCULAR | Status: AC
  Filled 2023-03-07: qty 1

## 2023-03-07 MED ORDER — FENTANYL CITRATE (PF) 100 MCG/2ML IJ SOLN: 25.0000 ug | INTRAMUSCULAR | Status: DC | PRN

## 2023-03-07 MED ORDER — SUGAMMADEX SODIUM 200 MG/2ML IV SOLN
INTRAVENOUS | Status: DC | PRN
  Administered 2023-03-07: 450 mg via INTRAVENOUS
  Administered 2023-03-07: 200 mg via INTRAVENOUS

## 2023-03-07 MED ORDER — PROPOFOL 10 MG/ML IV BOLUS
INTRAVENOUS | Status: AC
  Filled 2023-03-07: qty 20

## 2023-03-07 MED ORDER — ACETAMINOPHEN 10 MG/ML IV SOLN: 1000.0000 mg | Freq: Once | INTRAVENOUS | Status: DC | PRN

## 2023-03-07 SURGICAL SUPPLY — 44 items
BAG DRN RND TRDRP ANRFLXCHMBR (UROLOGICAL SUPPLIES) ×2
BAG URINE DRAIN 2000ML AR STRL (UROLOGICAL SUPPLIES) ×2 IMPLANT
BLADE CLIPPER SENSICLIP SURGIC (BLADE) ×2 IMPLANT
CATH FOLEY 2WAY SLVR 5CC 16FR (CATHETERS) ×2 IMPLANT
CATH ROBINSON RED A/P 16FR (CATHETERS) IMPLANT
CATH ROBINSON RED A/P 20FR (CATHETERS) ×2 IMPLANT
CLOTH BEACON ORANGE TIMEOUT ST (SAFETY) ×2 IMPLANT
CNTNR URN SCR LID CUP LEK RST (MISCELLANEOUS) ×2 IMPLANT
CONT SPEC 4OZ STRL OR WHT (MISCELLANEOUS)
COVER BACK TABLE 60X90IN (DRAPES) ×2 IMPLANT
COVER MAYO STAND STRL (DRAPES) ×2 IMPLANT
DRSG TEGADERM 4X4.75 (GAUZE/BANDAGES/DRESSINGS) ×2 IMPLANT
DRSG TEGADERM 8X12 (GAUZE/BANDAGES/DRESSINGS) ×2 IMPLANT
GAUZE SPONGE 4X4 12PLY STRL (GAUZE/BANDAGES/DRESSINGS) IMPLANT
GEL ULTRASOUND 20GR AQUASONIC (MISCELLANEOUS) ×4 IMPLANT
GLOVE BIO SURGEON STRL SZ 6.5 (GLOVE) IMPLANT
GLOVE BIO SURGEON STRL SZ7.5 (GLOVE) ×2 IMPLANT
GLOVE BIOGEL PI IND STRL 6.5 (GLOVE) IMPLANT
GLOVE SURG ORTHO 8.5 STRL (GLOVE) ×2 IMPLANT
GOWN STRL REUS W/TWL XL LVL3 (GOWN DISPOSABLE) ×2 IMPLANT
GRID BRACH TEMP 18GA 2.8X3X.75 (MISCELLANEOUS) ×2 IMPLANT
HOLDER FOLEY CATH W/STRAP (MISCELLANEOUS) IMPLANT
I-125 seeds IMPLANT
IMPL SPACEOAR VUE SYSTEM (Spacer) ×2 IMPLANT
IMPLANT SPACEOAR VUE SYSTEM (Spacer) ×2 IMPLANT
IV NS 1000ML (IV SOLUTION) ×2
IV NS 1000ML BAXH (IV SOLUTION) ×2 IMPLANT
KIT TURNOVER CYSTO (KITS) ×2 IMPLANT
NDL BRACHY 18G 5PK (NEEDLE) ×8 IMPLANT
NDL BRACHY 18G SINGLE (NEEDLE) IMPLANT
NDL PK MORGANSTERN STABILIZ (NEEDLE) ×2 IMPLANT
NEEDLE BRACHY 18G 5PK (NEEDLE) ×8 IMPLANT
NEEDLE BRACHY 18G SINGLE (NEEDLE) IMPLANT
NEEDLE PK MORGANSTERN STABILIZ (NEEDLE) ×2 IMPLANT
PACK CYSTO (CUSTOM PROCEDURE TRAY) ×2 IMPLANT
SHEATH ULTRASOUND LF (SHEATH) IMPLANT
SHEATH ULTRASOUND LTX NONSTRL (SHEATH) IMPLANT
SLEEVE SCD COMPRESS KNEE MED (STOCKING) ×2 IMPLANT
SUT BONE WAX W31G (SUTURE) IMPLANT
SYR 10ML LL (SYRINGE) ×2 IMPLANT
SYR CONTROL 10ML LL (SYRINGE) ×2 IMPLANT
TOWEL OR 17X24 6PK STRL BLUE (TOWEL DISPOSABLE) ×2 IMPLANT
UNDERPAD 30X36 HEAVY ABSORB (UNDERPADS AND DIAPERS) ×4 IMPLANT
WATER STERILE IRR 500ML POUR (IV SOLUTION) ×2 IMPLANT

## 2023-03-07 NOTE — Discharge Instructions (Addendum)
Antibiotics °You may be given a prescription for an antibiotic to take when you arrive home. If so, be sure to take every tablet in the bottle, even if you are feeling better before the prescription is finished. If you begin itching, notice a rash or start to swell on your trunk, arms, legs and/or throat, immediately stop taking the antibiotic and call your Urologist. °Diet °Resume your usual diet when you return home. To keep your bowels moving easily and softly, drink prune, apple and cranberry juice at room temperature. You may also take a stool softener, such as Colace, which is available without prescription at local pharmacies. °Daily activities °? No driving or heavy lifting for at least two days after the implant. °? No bike riding, horseback riding or riding lawn mowers for the first month after the implant. °? Any strenuous physical activity should be approved by your doctor before you resume it. °Sexual relations °You may resume sexual relations two weeks after the procedure. A condom should be used for the first two weeks. Your semen may be dark brown or black; this is normal and is related bleeding that may have occurred during the implant. °Postoperative swelling °Expect swelling and bruising of the scrotum and perineum (the area between the scrotum and anus). Both the swelling and the bruising should resolve in l or 2 weeks. Ice packs and over- the-counter medications such as Tylenol, Advil or Aleve may lessen your discomfort. °Postoperative urination °Most men experience burning on urination and/or urinary frequency. If this becomes bothersome, contact your Urologist.  Medication can be prescribed to relieve these problems.  It is normal to have some blood in your urine for a few days after the implant. °Special instructions related to the seeds °It is unlikely that you will pass an Iodine-125 seed in your urine. The seeds are silver in color and are about as large as a grain of rice. If you pass a  seed, do not handle it with your fingers. Use a spoon to place it in an envelope or jar in place this in base occluded area such as the garage or basement for return to the radiation clinic at your convenience. ° °Contact your doctor for °? Temperature greater than 101 F °? Increasing pain °? Inability to urinate °Follow-up ° You should have follow up with your urologist and radiation oncologist about 3 weeks after the procedure. °General information regarding Iodine seeds °? Iodine-125 is a low energy radioactive material. It is not deeply penetrating and loses energy at short distances. Your prostate will absorb the radiation. Objects that are touched or used by the patient do not become radioactive. °? Body wastes (urine and stool) or body fluids (saliva, tears, semen or blood) are not radioactive. °? The Nuclear Regulatory Commission (NRC) has determined that no radiation precautions are needed for patients undergoing Iodine-125 seed implantation. The NRC states that such patients do not present a risk to the people around them, including young children and pregnant women. However, in keeping with the general principle that radiation exposure should be kept as low reasonably possible, we suggest the following: °? Children and pets should not sit on the patient's lap for the first two (2) weeks after the implant. °? Pregnant (or possibly pregnant) women should avoid prolonged, close contact with the patient for the first two (2) weeks after the implant. °? A distance of three (3) feet is acceptable. °? At a distance of three (3) feet, there is no limit to the   Home Care Instructions  Activity: Get plenty of rest for the remainder of the day. A responsible individual must stay with you for 24 hours following the procedure.  For the next 24 hours, DO NOT: -Drive a car -Operate machinery -Drink alcoholic beverages -Take any medication unless instructed by your  physician -Make any legal decisions or sign important papers.  Meals: Start with liquid foods such as gelatin or soup. Progress to regular foods as tolerated. Avoid greasy, spicy, heavy foods. If nausea and/or vomiting occur, drink only clear liquids until the nausea and/or vomiting subsides. Call your physician if vomiting continues.  Special Instructions/Symptoms: Your throat may feel dry or sore from the anesthesia or the breathing tube placed in your throat during surgery. If this causes discomfort, gargle with warm salt water. The discomfort should disappear within 24 hours.      

## 2023-03-07 NOTE — Progress Notes (Signed)
Radiation Oncology         [phone] ________________________________  Name: Mike Mullen MRN: [id number]  Date: 03/07/2023  DOB: Aug 27, 1960       Prostate Seed Implant  GM:WNUUVO, Provider Not In  No ref. provider found  DIAGNOSIS:   62 y.o. gentleman with Stage T1c adenocarcinoma of the prostate with Gleason score of 3+4, and PSA of 4.16.   Oncology History  Malignant neoplasm of prostate (HCC)  10/31/2022 Cancer Staging   Staging form: Prostate, AJCC 8th Edition - Clinical stage from 10/31/2022: Stage IIB (cT1c, cN0, cM0, PSA: 4.2, Grade Group: 2) - Signed by Marcello Fennel, PA-C on 12/24/2022 Histopathologic type: Adenocarcinoma, NOS Stage prefix: Initial diagnosis Prostate specific antigen (PSA) range: Less than 10 Gleason primary pattern: 3 Gleason secondary pattern: 4 Gleason score: 7 Histologic grading system: 5 grade system Number of biopsy cores examined: 12 Number of biopsy cores positive: 4 Location of positive needle core biopsies: Both sides   12/24/2022 Initial Diagnosis   Malignant neoplasm of prostate (HCC)       ICD-10-CM   1. Pre-op testing  Z01.818 CBG per Guidelines for Diabetes Management for Patients Undergoing Surgery ([facility], [facility], and [facility] only)    CBG per protocol    CANCELED: I-Stat, Chem 8 on day of surgery per protocol    CANCELED: EKG 12 lead per protocol      PROCEDURE: Insertion of radioactive I-125 seeds into the prostate gland.  RADIATION DOSE: 145 Gy, definitive therapy.  TECHNIQUE: Mike Mullen was brought to the operating room with the urologist. He was placed in the dorsolithotomy position. He was catheterized and a rectal tube was inserted. The perineum was shaved, prepped and draped. The ultrasound probe was then introduced by me into the rectum to see the prostate gland.  TREATMENT DEVICE: I attached the needle grid to the ultrasound probe stand and anchor needles were placed.  3D PLANNING: The prostate was imaged in 3D using a  sagittal sweep of the prostate probe. These images were transferred to the planning computer. There, the prostate, urethra and rectum were defined on each axial reconstructed image. Then, the software created an optimized 3D plan and a few seed positions were adjusted. The quality of the plan was reviewed using [facility] information for the target and the following two organs at risk:  Urethra and Rectum.  Then the accepted plan was printed and handed off to the radiation therapist.  Under my supervision, the custom loading of the seeds and spacers was carried out using the quick loader.  These pre-loaded needles were then placed into the needle holder.Marland Kitchen  PROSTATE VOLUME STUDY:  Using transrectal ultrasound the volume of the prostate was verified to be 51.2 cc.  SPECIAL TREATMENT PROCEDURE/SUPERVISION AND HANDLING: The pre-loaded needles were then delivered by the urologist under sagittal guidance. A total of 16 needles were used to deposit 63 seeds in the prostate gland. The individual seed activity was 0.545 mCi.  SpaceOAR:  Yes  COMPLEX SIMULATION: At the end of the procedure, an anterior radiograph of the pelvis was obtained to document seed positioning and count. Cystoscopy was performed by the urologist to check the urethra and bladder.  MICRODOSIMETRY: At the end of the procedure, the patient was emitting 0.095 mR/hr at 1 meter. Accordingly, he was considered safe for hospital discharge.  PLAN: The patient will return to the radiation oncology clinic for post implant CT dosimetry in three weeks.   ________________________________  Artist Pais Kathrynn Running, M.D.

## 2023-03-07 NOTE — Anesthesia Postprocedure Evaluation (Signed)
Anesthesia Post Note  Patient: Mike Mullen  Procedure(s) Performed: RADIOACTIVE SEED IMPLANT/BRACHYTHERAPY IMPLANT (Prostate) SPACE OAR INSTILLATION (Perineum)     Patient location during evaluation: PACU Anesthesia Type: General Level of consciousness: awake and alert Pain management: pain level controlled Vital Signs Assessment: post-procedure vital signs reviewed and stable Respiratory status: spontaneous breathing, nonlabored ventilation, respiratory function stable and patient connected to nasal cannula oxygen Cardiovascular status: blood pressure returned to baseline and stable Postop Assessment: no apparent nausea or vomiting Anesthetic complications: no   No notable events documented.  Last Vitals:  Vitals:   03/07/23 1300 03/07/23 1325  BP: 110/67 (!) 146/84  Pulse: 98 96  Resp: 15 16  Temp:  36.6 C  SpO2: 96% 99%    Last Pain:  Vitals:   03/07/23 1325  TempSrc:   PainSc: 0-No pain                 [place]

## 2023-03-07 NOTE — Anesthesia Procedure Notes (Signed)
Procedure Name: Intubation Date/Time: 03/07/2023 11:07 AM  Performed by: Briant Sites, CRNAPre-anesthesia Checklist: Patient identified, Emergency Drugs available, Suction available and Patient being monitored Patient Re-evaluated:Patient Re-evaluated prior to induction Oxygen Delivery Method: Circle system utilized Preoxygenation: Pre-oxygenation with 100% oxygen Induction Type: IV induction Ventilation: Mask ventilation without difficulty Laryngoscope Size: Mac and 4 Grade View: Grade I Tube type: Oral Tube size: 7.5 mm Number of attempts: 1 Airway Equipment and Method: Stylet Placement Confirmation: ETT inserted through vocal cords under direct vision, positive ETCO2 and breath sounds checked- equal and bilateral Secured at: 22 cm Tube secured with: Tape Dental Injury: Teeth and Oropharynx as per pre-operative assessment

## 2023-03-07 NOTE — Transfer of Care (Signed)
Immediate Anesthesia Transfer of Care Note  Patient: Mike Mullen  Procedure(s) Performed: RADIOACTIVE SEED IMPLANT/BRACHYTHERAPY IMPLANT (Prostate) SPACE OAR INSTILLATION (Perineum)  Patient Location: PACU  Anesthesia Type:General  Level of Consciousness: sedated  Airway & Oxygen Therapy: Patient Spontanous Breathing and Patient connected to nasal cannula oxygen  Post-op Assessment: Report given to RN and Post -op Vital signs reviewed and stable  Post vital signs: Reviewed  Last Vitals:  Vitals Value Taken Time  BP 135/78   Temp    Pulse 104 03/07/23 1231  Resp 15 03/07/23 1231  SpO2 97 % 03/07/23 1231  Vitals shown include unfiled device data.  Last Pain:  Vitals:   03/07/23 0919  TempSrc: Oral  PainSc: 0-No pain      Patients Stated Pain Goal: 5 (03/07/23 0919)  Complications: No notable events documented.

## 2023-03-07 NOTE — Op Note (Signed)
PATIENT:  Mike Mullen  PRE-OPERATIVE DIAGNOSIS:  Adenocarcinoma of the prostate  POST-OPERATIVE DIAGNOSIS:  Same  PROCEDURE:  1. I-125 radioactive seed implantation 2. Cystoscopy  3. Placement of SpaceOAR  SURGEON:  Surgeon(s): Rutherford Nail, MD  Radiation oncologist: Dr. Margaretmary Dys  ANESTHESIA:  General  EBL:  Minimal  DRAINS: 16 French Foley catheter  INDICATION: Mike Mullen  Description of procedure: After informed consent the patient was brought to the major OR, placed on the table and administered general anesthesia. He was then moved to the modified lithotomy position with his perineum perpendicular to the floor. His perineum and genitalia were then sterilely prepped. An official timeout was then performed. A 16 French Foley catheter was then placed in the bladder and filled with dilute contrast, a rectal tube was placed in the rectum and the transrectal ultrasound probe was placed in the rectum and affixed to the stand. He was then sterilely draped.  Real time ultrasonography was used along with the seed planning software Oncentra Prostate. This was used to develop the seed plan including the number of needles as well as number of seeds required for complete and adequate coverage. Real-time ultrasonography was then used along with the previously developed plan and the Nucletron device to implant a total of 63 seeds using 16 needles. This proceeded without difficulty or complication.   I then proceeded with placement of SpaceOAR by introducing a needle with the bevel angled inferiorly approximately 2 cm superior to the anus. This was angled downward and under direct ultrasound was placed within the space between the prostatic capsule and rectum. This was confirmed with a small amount of sterile saline injected and this was performed under direct ultrasound. I then attached the SpaceOAR to the needle and injected this in the space between the prostate and rectum with good  placement noted.  A Foley catheter was then removed as well as the transrectal ultrasound probe and rectal probe. Flexible cystoscopy was then performed using the 17 French flexible scope which revealed a normal urethra throughout its length down to the sphincter which appeared intact. The prostatic urethra revealed bilobar hypertrophy but no evidence of obstruction, seeds, spacers or lesions. The bladder was then entered and fully and systematically inspected. The ureteral orifices were noted to be of normal configuration and position. The mucosa revealed no evidence of tumors. There were also no stones identified within the bladder. I noted no seeds or spacers on the floor of the bladder and retroflexion of the scope revealed no seeds protruding from the base of the prostate.  The cystoscope was then removed and the patient was awakened and taken to recovery room in stable and satisfactory condition. He tolerated procedure well and there were no intraoperative complications.

## 2023-03-07 NOTE — Anesthesia Preprocedure Evaluation (Addendum)
Anesthesia Evaluation  Patient identified by MRN, date of birth, ID band Patient awake    Reviewed: Allergy & Precautions, H&P , NPO status , Patient's Chart, lab work & pertinent test results  Airway Mallampati: II  TM Distance: >3 FB Neck ROM: Full    Dental no notable dental hx.    Pulmonary neg pulmonary ROS   Pulmonary exam normal breath sounds clear to auscultation       Cardiovascular hypertension, Normal cardiovascular exam Rhythm:Regular Rate:Normal     Neuro/Psych negative neurological ROS  negative psych ROS   GI/Hepatic negative GI ROS, Neg liver ROS,,,  Endo/Other  negative endocrine ROS    Renal/GU negative Renal ROS   Hx of prostate cancer.    Musculoskeletal negative musculoskeletal ROS (+)    Abdominal   Peds negative pediatric ROS (+)  Hematology negative hematology ROS (+)   Anesthesia Other Findings   Reproductive/Obstetrics negative OB ROS                             Anesthesia Physical Anesthesia Plan  ASA: 3  Anesthesia Plan: General   Post-op Pain Management:    Induction: Intravenous  PONV Risk Score and Plan: Ondansetron and Dexamethasone  Airway Management Planned: LMA  Additional Equipment:   Intra-op Plan:   Post-operative Plan: Extubation in OR  Informed Consent: I have reviewed the patients History and Physical, chart, labs and discussed the procedure including the risks, benefits and alternatives for the proposed anesthesia with the patient or authorized representative who has indicated his/her understanding and acceptance.     Dental advisory given  Plan Discussed with: CRNA  Anesthesia Plan Comments:         Anesthesia Quick Evaluation

## 2023-03-07 NOTE — H&P (Signed)
CC/HPI: Mike Mullen presents today for pre-operative history and physical exam in anticipation of radioactive seed and space oar placement by Dr. Alvester Morin on 03/07/23. He is doing well and is without complaint.   Mike Mullen denies F/C, HA, CP, SOB, N/V, diarrhea/constipation, back pain, flank pain, hematuria, and dysuria.    HX:   CC: Favorable intermediate risk prostate cancer  HPI:  08/15/2022  62 year old male recently moved here from [place]. He states he had a PSA back in December and was told it was elevated. Unsure of the value. States it was rechecked in January and was told it was lower. Again not sure of that value either. He was told to follow-up for another value. Therefore, he presents here for evaluation. He does have some mild voiding complaints including frequency, intermittency, urgency, weak stream, nocturia x 1. He is however pleased with how he is doing and not bothered. Not on any medication for his prostate. Uses sildenafil as needed for erectile dysfunction and this works well. He denies any hematuria or dysuria. He does have family history of prostate cancer in his father who was diagnosed around the age of 77. Unknown treatment and he ultimately died of emphysema at 28.   12/01/2022  Patient underwent MRI of the prostate that revealed a 56 g prostate. There were no high-grade lesions. He subsequent underwent a transrectal ultrasound-guided biopsy that revealed a 61 g prostate. Unfortunately, pathology was positive for adenocarcinoma the prostate in 4 out of 12 cores, maximum Gleason score of 3+4 in 2 of the cores. He has had bilateral open inguinal hernia repair with mesh.     ALLERGIES: No Known Drug Allergies    MEDICATIONS: Finasteride 5 mg tablet 1 tablet PO Daily  Hydrochlorothiazide  Amlodipine Besylate 10 mg tablet  Emtricitabine  Hydroxyurea  Sildenafil Citrate 100 mg tablet     GU PSH: Prostate Needle Biopsy - 10/31/2022     NON-GU PSH: Hernia Repair Surgical Pathology, Gross And  Microscopic Examination For Prostate Needle - 10/31/2022 Visit [facility] (formerly [facility]) - Dec 01, 2022     GU PMH: Prostate Cancer - 12/01/22 Elevated PSA - 10/31/2022 BPH w/LUTS - 08/15/2022 Encounter for Prostate Cancer screening - 08/15/2022 Family Hx of Prostate Cancer - 08/15/2022 Urinary Frequency - 08/15/2022 Urinary Urgency - 08/15/2022 Weak Urinary Stream - 08/15/2022    NON-GU PMH: Hypertension    FAMILY HISTORY: Dementia - Mother Emphysema - Father Prostate Cancer - Runs in Family   SOCIAL HISTORY: Marital Status: Single Preferred Language: English; Race: Black or African American Current Smoking Status: Patient has never smoked.   Tobacco Use Assessment Completed: Used Tobacco in last 30 days? Does not use smokeless tobacco. Does drink.  Does not use drugs. Drinks 1 caffeinated drink per day. Has not had a blood transfusion.     Notes: ETOH rare    REVIEW OF SYSTEMS:    GU Review Male:   Patient denies frequent urination, hard to postpone urination, burning/ pain with urination, get up at night to urinate, leakage of urine, stream starts and stops, trouble starting your stream, have to strain to urinate , erection problems, and penile pain.  Gastrointestinal (Upper):   Patient denies vomiting, indigestion/ heartburn, and nausea.  Gastrointestinal (Lower):   Patient denies diarrhea and constipation.  Constitutional:   Patient denies fever, night sweats, weight loss, and fatigue.  Skin:   Patient denies skin rash/ lesion and itching.  Eyes:   Patient denies blurred vision and double vision.  Ears/ Nose/ Throat:  Patient denies sore throat and sinus problems.  Hematologic/Lymphatic:   Patient denies swollen glands and easy bruising.  Cardiovascular:   Patient denies leg swelling and chest pains.  Respiratory:   Patient denies cough and shortness of breath.  Endocrine:   Patient denies excessive thirst.  Musculoskeletal:   Patient denies back pain and joint pain.   Neurological:   Patient denies headaches and dizziness.  Psychologic:   Patient denies depression and anxiety.   VITAL SIGNS:      02/25/2023 02:12 PM  Weight 235 lb / 106.59 kg  Height 77 in / 195.58 cm  BP 121/82 mmHg  Heart Rate 96 /min  Temperature 97.7 F / 36.5 C  BMI 27.9 kg/m   MULTI-SYSTEM PHYSICAL EXAMINATION:    Constitutional: Well-nourished. No physical deformities. Normally developed. Good grooming.  Neck: Neck symmetrical, not swollen. Normal tracheal position.  Respiratory: Normal breath sounds. No labored breathing, no use of accessory muscles.   Cardiovascular: Regular rate and rhythm. No murmur, no gallop.   Lymphatic: No enlargement of neck, axillae, groin.  Skin: No paleness, no jaundice, no cyanosis. No lesion, no ulcer, no rash.  Neurologic / Psychiatric: Oriented to time, oriented to place, oriented to person. No depression, no anxiety, no agitation.  Gastrointestinal: No mass, no tenderness, no rigidity, non obese abdomen.  Eyes: Normal conjunctivae. Normal eyelids.  Ears, Nose, Mouth, and Throat: Left ear no scars, no lesions, no masses. Right ear no scars, no lesions, no masses. Nose no scars, no lesions, no masses. Normal hearing. Normal lips.  Musculoskeletal: Normal gait and station of head and neck.     Complexity of Data:  Records Review:   Previous Patient Records  Urine Test Review:   Urinalysis   02/25/23  Urinalysis  Urine Appearance Clear   Urine Color Yellow   Urine Glucose Neg mg/dL  Urine Bilirubin Neg mg/dL  Urine Ketones Neg mg/dL  Urine Specific Gravity 1.010   Urine Blood Neg ery/uL  Urine pH 8.5   Urine Protein Neg mg/dL  Urine Urobilinogen 0.2 mg/dL  Urine Nitrites Neg   Urine Leukocyte Esterase Neg leu/uL   PROCEDURES:          Urinalysis - 81003 Dipstick Dipstick Cont'd  Color: Yellow Bilirubin: Neg mg/dL  Appearance: Clear Ketones: Neg mg/dL  Specific Gravity: 0.981 Blood: Neg ery/uL  pH: 8.5 Protein: Neg mg/dL   Glucose: Neg mg/dL Urobilinogen: 0.2 mg/dL    Nitrites: Neg    Leukocyte Esterase: Neg leu/uL    ASSESSMENT:      ICD-10 Details  1 GU:   Prostate Cancer - C61    PLAN:           Schedule Return Visit/Planned Activity: Keep Scheduled Appointment - Schedule Surgery          Document Letter(s):  Created for Patient: Clinical Summary         Notes:   There are no changes in the patients history or physical exam since last evaluation by Dr. Alvester Morin. Mike Mullen is scheduled to undergo seeds and space oar on 03/10/23.   All Mike Mullen's questions were answered to the best of my ability.          Next Appointment:      Next Appointment: 03/07/2023 11:00 AM    Appointment Type: Surgery     Location: [facility] [phone]    Provider: Modena Slater, III, M.D.    Reason for Visit: OP NE SEEDS SPACE OAR  Signed by Ulyses Amor, PA on 02/25/23 at 2:23 PM (EDT

## 2023-03-10 ENCOUNTER — Encounter (HOSPITAL_BASED_OUTPATIENT_CLINIC_OR_DEPARTMENT_OTHER): Payer: Self-pay | Admitting: Urology

## 2023-03-13 ENCOUNTER — Other Ambulatory Visit (HOSPITAL_COMMUNITY): Payer: Self-pay

## 2023-03-13 MED ORDER — WEGOVY 0.5 MG/0.5ML ~~LOC~~ SOAJ
SUBCUTANEOUS | 0 refills | Status: DC
  Filled 2023-03-13 – 2023-03-18 (×2): qty 2, 28d supply, fill #0

## 2023-03-18 ENCOUNTER — Other Ambulatory Visit (HOSPITAL_COMMUNITY): Payer: Self-pay

## 2023-03-19 ENCOUNTER — Other Ambulatory Visit (HOSPITAL_COMMUNITY): Payer: Self-pay

## 2023-03-20 ENCOUNTER — Other Ambulatory Visit (HOSPITAL_COMMUNITY): Payer: Self-pay

## 2023-03-20 MED ORDER — TAMSULOSIN HCL 0.4 MG PO CAPS
0.4000 mg | ORAL_CAPSULE | Freq: Every day | ORAL | 3 refills | Status: AC
  Filled 2023-03-20: qty 30, 30d supply, fill #0
  Filled 2023-04-06 – 2023-04-15 (×2): qty 30, 30d supply, fill #1
  Filled 2023-05-11: qty 30, 30d supply, fill #2
  Filled 2023-06-03: qty 30, 30d supply, fill #3

## 2023-03-25 ENCOUNTER — Telehealth: Payer: Self-pay | Admitting: *Deleted

## 2023-03-25 NOTE — Telephone Encounter (Signed)
CALLED PATIENT TO REMIND OF POST SEED APPTS. FOR 03-27-23, SPOKE WITH PATIENT AND HE IS AWARE OF THESE APPTS.

## 2023-03-26 NOTE — Progress Notes (Signed)
Radiation Oncology         [phone] ________________________________  Name: Mike Mullen MRN: [id number]  Date: 03/27/2023  DOB: May 19, 1961  Post-Seed Follow-Up Visit Note  CC: System, Provider Not In  Mike Elliot, MD  Diagnosis:    62 y.o. gentleman with Stage T1c adenocarcinoma of the prostate with Gleason score of 3+4, and PSA of 4.16.     ICD-10-CM   1. Malignant neoplasm of prostate (HCC)  C61       Interval Since Last Radiation:  3 weeks 03/07/23:  Insertion of radioactive I-125 seeds into the prostate gland; 145 Gy, definitive therapy with placement of SpaceOAR gel.  Narrative:  The patient returns today for routine follow-up.  He is complaining of increased urinary frequency and urinary hesitation symptoms. He filled out a questionnaire regarding urinary function today providing and overall IPSS score of 17 characterizing his symptoms as moderate-severe but improved on Flomax which was started 03/20/23.  His pre-implant score was 6. He denies any abdominal pain or bowel symptoms aside from occasional constipation that he is managing with diet. His stamina is slightly decreased but he has been able to remain active and overall, is pleased with his progress to date.  ALLERGIES:  has No Known Allergies.  Meds: Current Outpatient Medications  Medication Sig Dispense Refill   amLODipine (NORVASC) 10 MG tablet Take 1 tablet every day by oral route, for HTN.     emtricitabine-tenofovir (TRUVADA) 200-300 MG tablet TAKE ONE TABLET BY MOUTH ONCE DAILY. STORE IN ORIGINAL CONTAINER AT ROOM TEMPERATURE.     hydrochlorothiazide (HYDRODIURIL) 25 MG tablet Take 1 tablet by mouth daily.     HYDROcodone-acetaminophen (NORCO/VICODIN) 5-325 MG tablet Take 1 tablet by mouth every 4 (four) hours as needed. 10 tablet 0   hydroxyurea (HYDREA) 500 MG capsule Take 2 capsules daily on Tuesday, Thursday, Saturday and Sunday and 3 capsules daily for the rest of week 70 capsule 1   latanoprost  (XALATAN) 0.005 % ophthalmic solution Place into both eyes daily.     Semaglutide-Weight Management (WEGOVY) 0.5 MG/0.5ML SOAJ Inject 0.5 mg by subcutaneous route as directed, for weight management. 2 mL 0   sildenafil (VIAGRA) 100 MG tablet Take 100 mg by mouth daily.     tamsulosin (FLOMAX) 0.4 MG CAPS capsule Take 1 capsule (0.4 mg total) by mouth daily. 30 capsule 3   No current facility-administered medications for this visit.    Physical Findings: In general this is a well appearing African American male in no acute distress. He's alert and oriented x4 and appropriate throughout the examination. Cardiopulmonary assessment is negative for acute distress and he exhibits normal effort.   Lab Findings: Lab Results  Component Value Date   WBC 2.6 (L) 02/19/2023   HGB 14.3 03/07/2023   HCT 42.0 03/07/2023   MCV 110.5 (H) 02/19/2023   PLT 421 (H) 02/19/2023    Radiographic Findings:  Patient underwent CT imaging in our clinic for post implant dosimetry. The CT will be reviewed by Dr. Kathrynn Running to confirm there is an adequate distribution of radioactive seeds throughout the prostate gland and ensure that there are no seeds in or near the rectum.  We suspect the final radiation plan and dosimetry will show appropriate coverage of the prostate gland. He understands that we will call and inform him of any unexpected findings on further review of his imaging and dosimetry.  Impression/Plan:  62 y.o. gentleman with Stage T1c adenocarcinoma of the prostate with Gleason  score of 3+4, and PSA of 4.16.  The patient is recovering from the effects of radiation. His urinary symptoms should gradually improve over the next 4-6 months. We talked about this today and his LUTS are improved and manageable on daily Flomax that was started 03/20/23. He is encouraged by his improvement already and is otherwise pleased with his outcome. We also talked about long-term follow-up for prostate cancer following seed  implant. He understands that ongoing PSA determinations and digital rectal exams will help perform surveillance to rule out disease recurrence. He was seen by Bartholomew Crews, NP on 03/24/23 and has a follow up appointment scheduled for labs on 07/04/23 and will see Dr. Alvester Morin on the following week on 07/11/23. He understands what to expect with his PSA measures. Patient was also educated today about some of the long-term effects from radiation including a small risk for rectal bleeding and possibly erectile dysfunction. We talked about some of the general management approaches to these potential complications. However, I did encourage the patient to contact our office or return at any point if he has questions or concerns related to his previous radiation and prostate cancer.    Marguarite Arbour, PA-C

## 2023-03-26 NOTE — Progress Notes (Signed)
  Radiation Oncology         [phone] ________________________________  Name: Mike Mullen MRN: [id number]  Date: 03/27/2023  DOB: August 23, 1960  COMPLEX SIMULATION NOTE  NARRATIVE:  The patient was brought to the CT Simulation planning suite today following prostate seed implantation approximately one month ago.  Identity was confirmed.  All relevant records and images related to the planned course of therapy were reviewed.  Then, the patient was set-up supine.  CT images were obtained.  The CT images were loaded into the planning software.  Then the prostate and rectum were contoured.  Treatment planning then occurred.  The implanted iodine 125 seeds were identified by the physics staff for projection of radiation distribution  I have requested : 3D Simulation  I have requested a DVH of the following structures: Prostate and rectum.    ________________________________  Artist Pais Kathrynn Running, M.D.

## 2023-03-27 ENCOUNTER — Encounter: Payer: Self-pay | Admitting: Urology

## 2023-03-27 ENCOUNTER — Ambulatory Visit
Admission: RE | Admit: 2023-03-27 | Discharge: 2023-03-27 | Disposition: A | Discharge status: Home / Self Care | Payer: 59 | Source: Ambulatory Visit | Attending: Radiation Oncology | Admitting: Radiation Oncology

## 2023-03-27 ENCOUNTER — Ambulatory Visit
Admission: RE | Admit: 2023-03-27 | Discharge: 2023-03-27 | Disposition: A | Discharge status: Home / Self Care | Payer: 59 | Source: Ambulatory Visit | Attending: Urology | Admitting: Urology

## 2023-03-27 VITALS — BP 123/84 | HR 102 | Temp 97.3°F | Resp 18 | Ht 76.0 in | Wt 235.0 lb

## 2023-03-27 DIAGNOSIS — K5900 Constipation, unspecified: Secondary | ICD-10-CM | POA: Insufficient documentation

## 2023-03-27 DIAGNOSIS — Z79899 Other long term (current) drug therapy: Secondary | ICD-10-CM | POA: Insufficient documentation

## 2023-03-27 DIAGNOSIS — C61 Malignant neoplasm of prostate: Secondary | ICD-10-CM | POA: Insufficient documentation

## 2023-03-27 DIAGNOSIS — Z79624 Long term (current) use of inhibitors of nucleotide synthesis: Secondary | ICD-10-CM | POA: Insufficient documentation

## 2023-03-27 NOTE — Progress Notes (Addendum)
Post-seed nursing interview for a diagnosis of  Stage T1c adenocarcinoma of the prostate with Gleason score of 3+4, and PSA of 4.16.  Patient identity verified x2. Patient reports polyuria, dysuria/ rectal tenderness 4/10, and mild constipation. No other issues conveyed at this time.  Meaningful use complete.  I-PSS score- 17 - Moderate SHIM score- 17 Urinary Management medication(s) Tamsulosin Urology appointment date- 07/04/2023 with Dr. Alvester Morin at [facility]  Vitals- BP 123/84 (BP Location: Left Arm, Patient Position: Sitting, Cuff Size: Normal)   Pulse (!) 102   Temp (!) 97.3 F (36.3 C) (Temporal)   Resp 18   Ht 6\' 4"  (1.93 m)   Wt 235 lb (106.6 kg)   SpO2 99%   BMI 28.61 kg/m   This concludes the interaction.  Ruel Favors, LPN

## 2023-04-03 ENCOUNTER — Other Ambulatory Visit (HOSPITAL_COMMUNITY): Payer: Self-pay

## 2023-04-03 ENCOUNTER — Other Ambulatory Visit: Payer: Self-pay | Admitting: Hematology

## 2023-04-03 MED ORDER — HYDROXYUREA 500 MG PO CAPS
1500.0000 mg | ORAL_CAPSULE | Freq: Every day | ORAL | 1 refills | Status: DC
  Filled 2023-04-03: qty 70, 28d supply, fill #0
  Filled 2023-04-28: qty 70, 28d supply, fill #1

## 2023-04-06 ENCOUNTER — Other Ambulatory Visit (HOSPITAL_COMMUNITY): Payer: Self-pay

## 2023-04-10 ENCOUNTER — Encounter: Payer: Self-pay | Admitting: Radiation Oncology

## 2023-04-10 ENCOUNTER — Other Ambulatory Visit (HOSPITAL_COMMUNITY): Payer: Self-pay

## 2023-04-10 DIAGNOSIS — C61 Malignant neoplasm of prostate: Secondary | ICD-10-CM | POA: Diagnosis not present

## 2023-04-10 MED ORDER — TAMSULOSIN HCL 0.4 MG PO CAPS
0.4000 mg | ORAL_CAPSULE | Freq: Every day | ORAL | 1 refills | Status: DC
  Filled 2023-04-10: qty 30, 30d supply, fill #0

## 2023-04-10 MED ORDER — WEGOVY 0.5 MG/0.5ML ~~LOC~~ SOAJ
0.5000 mg | SUBCUTANEOUS | 0 refills | Status: DC
  Filled 2023-04-10: qty 2, 28d supply, fill #0

## 2023-04-10 NOTE — Progress Notes (Signed)
Radiation Oncology         [phone] ________________________________  Name: [name] MRN: [id number]  Date: 04/10/2023  DOB: September 22, 1960  3D Planning Note   Prostate Brachytherapy Post-Implant Dosimetry  Diagnosis: 62 y.o. gentleman with Stage T1c adenocarcinoma of the prostate with Gleason score of 3+4, and PSA of 4.16.   Narrative: On a previous date, Mike Mullen returned following prostate seed implantation for post implant planning. He underwent CT scan complex simulation to delineate the three-dimensional structures of the pelvis and demonstrate the radiation distribution.  Since that time, the seed localization, and complex isodose planning with dose volume histograms have now been completed.  Results:   Prostate Coverage - The dose of radiation delivered to the 90% or more of the prostate gland (D90) was 120.27% of the prescription dose. This exceeds our goal of greater than 90%. Rectal Sparing - The volume of rectal tissue receiving the prescription dose or higher was 0.0 cc. This falls under our thresholds tolerance of 1.0 cc.  Impression: The prostate seed implant appears to show adequate target coverage and appropriate rectal sparing.  Plan:  The patient will continue to follow with urology for ongoing PSA determinations. I would anticipate a high likelihood for local tumor control with minimal risk for rectal morbidity.  ________________________________  Artist Pais Kathrynn Running, M.D.

## 2023-04-10 NOTE — Radiation Completion Notes (Addendum)
  Radiation Oncology         [phone] ________________________________  Name: Mike Mullen MRN: [id number]  Date: 04/10/2023  DOB: January 24, 1961  Referring Physician: Sherwood Edison, M.D. Date of Service: 2023-04-10 Radiation Oncologist: Adina Barge, M.D. [facility]     RADIATION ONCOLOGY END OF TREATMENT NOTE     Diagnosis:  62 y.o. gentleman with Stage T1c adenocarcinoma of the prostate with Gleason score of 3+4, and PSA of 4.16.   Intent: Curative     ==========DELIVERED PLANS==========  Prostate Seed Implant Date: 2023-03-07   Plan Name: Prostate Seed Implant Site: Prostate Technique: Radioactive Seed Implant I-125 Mode: Brachytherapy Dose Per Fraction: 145 Gy Prescribed Dose (Delivered / Prescribed): 145 Gy / 145 Gy Prescribed Fxs (Delivered / Prescribed): 1 / 1     ==========ON TREATMENT VISIT DATES========== 2023-03-07     The patient will receive a call in about one month from the radiation oncology department. He will continue follow up with Dr. Edison as well.  ------------------------------------------------   Donnice Barge, MD [facility]  Radiation Oncology Direct Dial: [phone]  Fax: [phone] .com  Skype  LinkedIn

## 2023-04-16 ENCOUNTER — Inpatient Hospital Stay: Payer: 59 | Attending: Hematology

## 2023-04-16 DIAGNOSIS — D473 Essential (hemorrhagic) thrombocythemia: Secondary | ICD-10-CM | POA: Diagnosis present

## 2023-04-16 LAB — CBC WITH DIFFERENTIAL (CANCER CENTER ONLY)
Abs Immature Granulocytes: 0.01 10*3/uL (ref 0.00–0.07)
Basophils Absolute: 0 10*3/uL (ref 0.0–0.1)
Basophils Relative: 0 %
Eosinophils Absolute: 0 10*3/uL (ref 0.0–0.5)
Eosinophils Relative: 1 %
HCT: 39.8 % (ref 39.0–52.0)
Hemoglobin: 13.9 g/dL (ref 13.0–17.0)
Immature Granulocytes: 0 %
Lymphocytes Relative: 16 %
Lymphs Abs: 0.6 10*3/uL — ABNORMAL LOW (ref 0.7–4.0)
MCH: 39.4 pg — ABNORMAL HIGH (ref 26.0–34.0)
MCHC: 34.9 g/dL (ref 30.0–36.0)
MCV: 112.7 fL — ABNORMAL HIGH (ref 80.0–100.0)
Monocytes Absolute: 0.4 10*3/uL (ref 0.1–1.0)
Monocytes Relative: 10 %
Neutro Abs: 2.7 10*3/uL (ref 1.7–7.7)
Neutrophils Relative %: 73 %
Platelet Count: 398 10*3/uL (ref 150–400)
RBC: 3.53 MIL/uL — ABNORMAL LOW (ref 4.22–5.81)
RDW: 11.2 % — ABNORMAL LOW (ref 11.5–15.5)
WBC Count: 3.8 10*3/uL — ABNORMAL LOW (ref 4.0–10.5)
nRBC: 0 % (ref 0.0–0.2)

## 2023-04-16 LAB — CMP (CANCER CENTER ONLY)
ALT: 16 U/L (ref 0–44)
AST: 16 U/L (ref 15–41)
Albumin: 4.4 g/dL (ref 3.5–5.0)
Alkaline Phosphatase: 66 U/L (ref 38–126)
Anion gap: 7 (ref 5–15)
BUN: 10 mg/dL (ref 8–23)
CO2: 29 mmol/L (ref 22–32)
Calcium: 9.5 mg/dL (ref 8.9–10.3)
Chloride: 104 mmol/L (ref 98–111)
Creatinine: 1.17 mg/dL (ref 0.61–1.24)
GFR, Estimated: >60 mL/min (ref >60)
Glucose, Bld: 100 mg/dL — ABNORMAL HIGH (ref 70–99)
Potassium: 3.8 mmol/L (ref 3.5–5.1)
Sodium: 140 mmol/L (ref 135–145)
Total Bilirubin: 0.5 mg/dL (ref 0.3–1.2)
Total Protein: 7.4 g/dL (ref 6.5–8.1)

## 2023-04-17 ENCOUNTER — Other Ambulatory Visit (HOSPITAL_COMMUNITY): Payer: Self-pay

## 2023-04-21 ENCOUNTER — Encounter: Payer: Self-pay | Admitting: *Deleted

## 2023-04-21 ENCOUNTER — Inpatient Hospital Stay: Payer: 59 | Admitting: *Deleted

## 2023-04-21 DIAGNOSIS — C61 Malignant neoplasm of prostate: Secondary | ICD-10-CM

## 2023-04-21 NOTE — Progress Notes (Signed)
SCP reviewed and completed. Pt will get post-treatment labs in December at [facility] with Dr. Alvester Morin.

## 2023-04-24 ENCOUNTER — Encounter: Payer: Self-pay | Admitting: Gastroenterology

## 2023-05-02 ENCOUNTER — Other Ambulatory Visit (HOSPITAL_COMMUNITY): Payer: Self-pay

## 2023-05-12 ENCOUNTER — Other Ambulatory Visit: Payer: Self-pay

## 2023-05-12 ENCOUNTER — Other Ambulatory Visit (HOSPITAL_COMMUNITY): Payer: Self-pay

## 2023-05-12 MED ORDER — SEMAGLUTIDE-WEIGHT MANAGEMENT 0.5 MG/0.5ML ~~LOC~~ SOAJ
0.5000 mg | SUBCUTANEOUS | 0 refills | Status: DC
  Filled 2023-05-12: qty 2, 28d supply, fill #0

## 2023-05-16 ENCOUNTER — Other Ambulatory Visit (HOSPITAL_COMMUNITY): Payer: Self-pay

## 2023-05-28 ENCOUNTER — Other Ambulatory Visit (HOSPITAL_COMMUNITY): Payer: Self-pay

## 2023-05-28 ENCOUNTER — Other Ambulatory Visit: Payer: Self-pay | Admitting: Hematology

## 2023-05-28 MED ORDER — HYDROXYUREA 500 MG PO CAPS
1500.0000 mg | ORAL_CAPSULE | Freq: Every day | ORAL | 1 refills | Status: DC
  Filled 2023-05-28: qty 70, 28d supply, fill #0
  Filled 2023-07-03: qty 70, 28d supply, fill #1

## 2023-05-29 ENCOUNTER — Other Ambulatory Visit (HOSPITAL_COMMUNITY): Payer: Self-pay

## 2023-06-03 ENCOUNTER — Other Ambulatory Visit: Payer: Self-pay

## 2023-07-02 ENCOUNTER — Telehealth: Payer: Self-pay | Admitting: Hematology

## 2023-07-03 ENCOUNTER — Other Ambulatory Visit (HOSPITAL_COMMUNITY): Payer: Self-pay

## 2023-07-04 ENCOUNTER — Other Ambulatory Visit (HOSPITAL_COMMUNITY): Payer: Self-pay

## 2023-07-04 MED ORDER — SEMAGLUTIDE-WEIGHT MANAGEMENT 0.5 MG/0.5ML ~~LOC~~ SOAJ
0.5000 mg | SUBCUTANEOUS | 0 refills | Status: AC
Start: 2023-06-30 — End: ?
  Filled 2023-07-04: qty 2, 28d supply, fill #0

## 2023-07-07 ENCOUNTER — Other Ambulatory Visit (HOSPITAL_COMMUNITY): Payer: Self-pay

## 2023-07-11 ENCOUNTER — Other Ambulatory Visit (HOSPITAL_COMMUNITY): Payer: Self-pay

## 2023-07-23 ENCOUNTER — Inpatient Hospital Stay: Payer: 59

## 2023-07-23 ENCOUNTER — Inpatient Hospital Stay: Payer: 59 | Admitting: Hematology

## 2023-07-24 ENCOUNTER — Other Ambulatory Visit (HOSPITAL_COMMUNITY): Payer: Self-pay

## 2023-07-25 ENCOUNTER — Other Ambulatory Visit (HOSPITAL_COMMUNITY): Payer: Self-pay

## 2023-08-01 ENCOUNTER — Other Ambulatory Visit: Payer: Self-pay | Admitting: Hematology

## 2023-08-01 ENCOUNTER — Other Ambulatory Visit (HOSPITAL_COMMUNITY): Payer: Self-pay

## 2023-08-01 MED ORDER — HYDROXYUREA 500 MG PO CAPS
1500.0000 mg | ORAL_CAPSULE | Freq: Every day | ORAL | 1 refills | Status: DC
  Filled 2023-08-01: qty 70, 23d supply, fill #0

## 2023-08-04 ENCOUNTER — Other Ambulatory Visit (HOSPITAL_COMMUNITY): Payer: Self-pay

## 2023-08-05 ENCOUNTER — Other Ambulatory Visit (HOSPITAL_COMMUNITY): Payer: Self-pay

## 2023-08-07 ENCOUNTER — Inpatient Hospital Stay: Payer: 59 | Attending: Hematology

## 2023-08-07 ENCOUNTER — Other Ambulatory Visit (HOSPITAL_COMMUNITY): Payer: Self-pay

## 2023-08-07 ENCOUNTER — Inpatient Hospital Stay: Payer: 59 | Admitting: Hematology

## 2023-08-07 VITALS — BP 135/79 | HR 86 | Temp 97.7°F | Resp 17 | Wt 237.4 lb

## 2023-08-07 DIAGNOSIS — C61 Malignant neoplasm of prostate: Secondary | ICD-10-CM | POA: Diagnosis not present

## 2023-08-07 DIAGNOSIS — D473 Essential (hemorrhagic) thrombocythemia: Secondary | ICD-10-CM | POA: Insufficient documentation

## 2023-08-07 DIAGNOSIS — Z7964 Long term (current) use of myelosuppressive agent: Secondary | ICD-10-CM | POA: Insufficient documentation

## 2023-08-07 LAB — CMP (CANCER CENTER ONLY)
ALT: 15 U/L (ref 0–44)
AST: 15 U/L (ref 15–41)
Albumin: 4.4 g/dL (ref 3.5–5.0)
Alkaline Phosphatase: 57 U/L (ref 38–126)
Anion gap: 5 (ref 5–15)
BUN: 15 mg/dL (ref 8–23)
CO2: 31 mmol/L (ref 22–32)
Calcium: 9.4 mg/dL (ref 8.9–10.3)
Chloride: 103 mmol/L (ref 98–111)
Creatinine: 1.11 mg/dL (ref 0.61–1.24)
GFR, Estimated: >60 mL/min (ref >60)
Glucose, Bld: 101 mg/dL — ABNORMAL HIGH (ref 70–99)
Potassium: 3.9 mmol/L (ref 3.5–5.1)
Sodium: 139 mmol/L (ref 135–145)
Total Bilirubin: 0.7 mg/dL (ref 0.0–1.2)
Total Protein: 7.3 g/dL (ref 6.5–8.1)

## 2023-08-07 LAB — CBC WITH DIFFERENTIAL (CANCER CENTER ONLY)
Abs Immature Granulocytes: 0.01 10*3/uL (ref 0.00–0.07)
Basophils Absolute: 0 10*3/uL (ref 0.0–0.1)
Basophils Relative: 1 %
Eosinophils Absolute: 0 10*3/uL (ref 0.0–0.5)
Eosinophils Relative: 1 %
HCT: 39.7 % (ref 39.0–52.0)
Hemoglobin: 14 g/dL (ref 13.0–17.0)
Immature Granulocytes: 0 %
Lymphocytes Relative: 15 %
Lymphs Abs: 0.5 10*3/uL — ABNORMAL LOW (ref 0.7–4.0)
MCH: 39.7 pg — ABNORMAL HIGH (ref 26.0–34.0)
MCHC: 35.3 g/dL (ref 30.0–36.0)
MCV: 112.5 fL — ABNORMAL HIGH (ref 80.0–100.0)
Monocytes Absolute: 0.3 10*3/uL (ref 0.1–1.0)
Monocytes Relative: 9 %
Neutro Abs: 2.6 10*3/uL (ref 1.7–7.7)
Neutrophils Relative %: 74 %
Platelet Count: 451 10*3/uL — ABNORMAL HIGH (ref 150–400)
RBC: 3.53 MIL/uL — ABNORMAL LOW (ref 4.22–5.81)
RDW: 11.3 % — ABNORMAL LOW (ref 11.5–15.5)
WBC Count: 3.5 10*3/uL — ABNORMAL LOW (ref 4.0–10.5)
nRBC: 0 % (ref 0.0–0.2)

## 2023-08-07 NOTE — Assessment & Plan Note (Signed)
Stage IIB, cT1cN0M0, with Gleason score 3+3= 6, baseline PSA 4.2 -Patient declined prostatectomy, he received brachytherapy by Dr. Berneice Heinrich in 02/2023. -monitor PSA

## 2023-08-07 NOTE — Progress Notes (Signed)
[facility]   [phone] [phone]   Clinic Follow up Note   Patient Care Team: System, Provider Not In as PCP - General (Internal Medicine) Cherlyn Cushing, RN as Oncology Nurse Navigator [name], MD as Consulting Physician (Urology) Margaretmary Dys, MD as Consulting Physician (Radiation Oncology) Axel Filler, Larna Daughters, NP as Nurse Practitioner (Hematology and Oncology) Maryclare Labrador, RN as Registered Nurse  Date of Service:  08/07/2023  CHIEF COMPLAINT: f/u of ET  CURRENT THERAPY:  Hydrea 1000 mg 4 days a week, and 1500 mg 3 days a week  Oncology History   Essential thrombocythemia (HCC) -h/o isolated thrombocytosis since at least 2015, never had venous or arterial thrombosis. -reports he was previously evaluated in 2018 while living in [place], had a bone marrow biopsy around that time. On review of his limited provided records (from 08/2018 - 10/2021), he was prescribed Hydrea 500mg  on 12/21/19, but it's not clear if he ever took it. We do not have the results of his BMB; he will try to find these in his records. -presented to ED on 11/15/21 with dizziness. He was found to have platelet count of 927k, the rest of CBC was normal. Brain MRI was negative. -abdomen US on 11/26/21 was negative. 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 -Lab reviewed, mild leukopenia has resolved, platelet count 442K today, well controlled  -Continue Hydrea at the same dose  Malignant neoplasm of prostate (HCC) Stage IIB, cT1cN0M0, with Gleason score 3+3= 6, baseline PSA 4.2 -Patient declined prostatectomy, he received brachytherapy by Dr. Berneice Heinrich in 02/2023. -monitor PSA   Assessment and Plan    Essential Thrombocythemia (ET) Chronic condition managed with Hydroxyurea (Hydrea). Current platelet count is 451, within the target range (<500).  Tolerating Hydrea well with mild leukopenia (WBC 3.5, normal 4-10) but no anemia. Discussed maintaining platelet count below 500 to reduce thrombotic risk and explained mild leukopenia as a known side effect of Hydrea. - Continue current Hydrea dosing regimen (two capsules on Tuesdays, Thursdays, Saturdays, and Sundays; three capsules on other days) - Order blood work in three months - Schedule follow-up visit in six months - Monitor kidney and liver function; contact if concerns arise  Prostate Cancer Prostate cancer with recent PSA monitoring. December PSA results were favorable. Follow-up with urologist today. Emphasized regular PSA monitoring for early recurrence detection. - Cancel PSA test ordered by this office as it was already done in December - Continue follow-up with urologist as scheduled.     Plan -continue hydrea at same daose -lab every 3 months and f/u in 6 months     SUMMARY OF ONCOLOGIC HISTORY: Oncology History  Malignant neoplasm of prostate (HCC)  10/31/2022 Cancer Staging   Staging form: Prostate, AJCC 8th Edition - Clinical stage from 10/31/2022: Stage IIB (cT1c, cN0, cM0, PSA: 4.2, Grade Group: 2) - Signed by Marcello Fennel, PA-C on 12/24/2022 Histopathologic type: Adenocarcinoma, NOS Stage prefix: Initial diagnosis Prostate specific antigen (PSA) range: Less than 10 Gleason primary pattern: 3 Gleason secondary pattern: 4 Gleason score: 7 Histologic grading system: 5 grade system Number of biopsy cores examined: 12 Number of biopsy cores positive: 4 Location of positive needle core biopsies: Both sides   12/24/2022 Initial Diagnosis   Malignant neoplasm of prostate [facility])      Discussed the use of AI scribe software for clinical note transcription with the patient,  who gave verbal consent to proceed.  History of Present Illness   The patient, a 63 year old with a history of Essential Thrombocytopenia (ET) and prostate cancer, presents for a routine  follow-up. He reports no new symptoms and overall feels well. His prostate cancer is being monitored by another provider, with recent PSA levels checked in December. The patient is scheduled for a follow-up appointment with his urologist later in the day.  Regarding his ET, the patient is currently on a regimen of Hydrea, taken two capsules on Tuesdays, Thursdays, Saturdays, and Sundays, and three capsules on the remaining days. He reports tolerating the medication well, with no stomach issues or other adverse effects.         All other systems were reviewed with the patient and are negative.  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   ED (erectile dysfunction)    Elevated PSA 10/31/2022   Hypertension    Prostate cancer (HCC)    Thrombocytopenia (HCC)    followed by dr Mosetta Putt at cancer center    SURGICAL HISTORY: Past Surgical History:  Procedure Laterality Date   HERNIA REPAIR     INGUINAL HERNIA REPAIR Left 02/19/2016   Procedure: OPEN LEFT INGUINAL HERNIA REPAIR;  Surgeon: Jimmye Norman, MD;  Location: [facility];  Service: General;  Laterality: Left;   INSERTION OF MESH Left 02/19/2016   Procedure: INSERTION OF MESH;  Surgeon: Jimmye Norman, MD;  Location: [facility];  Service: General;  Laterality: Left;   PROSTATE BIOPSY     RADIOACTIVE SEED IMPLANT N/A 03/07/2023   Procedure: RADIOACTIVE SEED IMPLANT/BRACHYTHERAPY IMPLANT;  Surgeon: [name], MD;  Location: [facility];  Service: Urology;  Laterality: N/A;   SPACE OAR INSTILLATION N/A 03/07/2023   Procedure: SPACE OAR INSTILLATION;  Surgeon: [name], MD;  Location: [facility];  Service: Urology;  Laterality: N/A;    I have reviewed the social history and family history with the patient and they are unchanged from previous note.  ALLERGIES:  has no known  allergies.  MEDICATIONS:  Current Outpatient Medications  Medication Sig Dispense Refill   amLODipine (NORVASC) 10 MG tablet Take 1 tablet every day by oral route, for HTN.     emtricitabine-tenofovir (TRUVADA) 200-300 MG tablet TAKE ONE TABLET BY MOUTH ONCE DAILY. STORE IN ORIGINAL CONTAINER AT ROOM TEMPERATURE.     hydrochlorothiazide (HYDRODIURIL) 25 MG tablet Take 1 tablet by mouth daily.     hydroxyurea (HYDREA) 500 MG capsule Take 2 capsules daily on Tuesday, Thursday, Saturday and Sunday and 3 capsules daily for the rest of week 70 capsule 1   latanoprost (XALATAN) 0.005 % ophthalmic solution Place into both eyes daily.     Semaglutide-Weight Management 0.5 MG/0.5ML SOAJ Take 0.5 mg by mouth once a week. 2 mL 0   sildenafil (VIAGRA) 100 MG tablet Take 100 mg by mouth as needed for erectile dysfunction.     tamsulosin (FLOMAX) 0.4 MG CAPS capsule Take 1 capsule (0.4 mg total) by mouth daily. 30 capsule 3   No current facility-administered medications for this visit.    PHYSICAL EXAMINATION: ECOG PERFORMANCE STATUS: 0 - Asymptomatic  Vitals:   08/07/23 1110  BP: 135/79  Pulse: 86  Resp: 17  Temp: 97.7 F (36.5 C)  SpO2: 100%   Wt Readings from Last 3 Encounters:  08/07/23 237 lb 6.4 oz (107.7 kg)  03/27/23 235 lb (106.6 kg)  03/07/23 239 lb 8 oz (108.6 kg)     GENERAL:alert, no distress and comfortable SKIN: skin color, texture, turgor are normal, no rashes or significant lesions EYES: normal, Conjunctiva are pink and non-injected, sclera clear Musculoskeletal:no cyanosis of digits and no clubbing  NEURO: alert & oriented x 3 with fluent speech, no focal motor/sensory deficits    LABORATORY DATA:  I have reviewed the data as listed    Latest Ref Rng & Units 08/07/2023   10:51 AM 04/16/2023    9:10 AM 03/07/2023    9:39 AM  CBC  WBC 4.0 - 10.5 K/uL 3.5  3.8    Hemoglobin 13.0 - 17.0 g/dL 78.2  95.6  21.3   Hematocrit 39.0 - 52.0 % 39.7  39.8  42.0   Platelets  150 - 400 K/uL 451  398          Latest Ref Rng & Units 08/07/2023   10:51 AM 04/16/2023    9:10 AM 03/07/2023    9:39 AM  CMP  Glucose 70 - 99 mg/dL 086  578  469   BUN 8 - 23 mg/dL 15  10  10    Creatinine 0.61 - 1.24 mg/dL 6.29  5.28  4.13   Sodium 135 - 145 mmol/L 139  140  140   Potassium 3.5 - 5.1 mmol/L 3.9  3.8  3.5   Chloride 98 - 111 mmol/L 103  104  103   CO2 22 - 32 mmol/L 31  29    Calcium 8.9 - 10.3 mg/dL 9.4  9.5    Total Protein 6.5 - 8.1 g/dL 7.3  7.4    Total Bilirubin 0.0 - 1.2 mg/dL 0.7  0.5    Alkaline Phos 38 - 126 U/L 57  66    AST 15 - 41 U/L 15  16    ALT 0 - 44 U/L 15  16        RADIOGRAPHIC STUDIES: I have personally reviewed the radiological images as listed and agreed with the findings in the report. No results found.    No orders of the defined types were placed in this encounter.  All questions were answered. The patient knows to call the clinic with any problems, questions or concerns. No barriers to learning was detected. The total time spent in the appointment was 15 minutes.     Malachy Mood, MD 08/07/2023

## 2023-08-07 NOTE — Assessment & Plan Note (Signed)
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 -reports he was previously evaluated in 2018 while living in [place], had a bone marrow biopsy around that time. On review of his limited provided records (from 08/2018 - 10/2021), he was prescribed Hydrea 500mg  on 12/21/19, but it's not clear if he ever took it. We do not have the results of his BMB; he will try to find these in his records. -presented to ED on 11/15/21 with dizziness. He was found to have platelet count of 927k, the rest of CBC was normal. Brain MRI was negative. -abdomen US on 11/26/21 was negative. 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 -Lab reviewed, mild leukopenia has resolved, platelet count 442K today, well controlled  -Continue Hydrea at the same dose

## 2023-08-15 ENCOUNTER — Other Ambulatory Visit (HOSPITAL_COMMUNITY): Payer: Self-pay

## 2023-09-11 ENCOUNTER — Other Ambulatory Visit: Payer: Self-pay

## 2023-09-11 MED ORDER — HYDROXYUREA 500 MG PO CAPS: 1500.0000 mg | ORAL_CAPSULE | Freq: Every day | ORAL | 1 refills | Status: DC

## 2023-10-15 ENCOUNTER — Other Ambulatory Visit: Payer: Self-pay | Admitting: Hematology

## 2023-11-06 ENCOUNTER — Telehealth: Payer: Self-pay | Admitting: Hematology

## 2023-11-06 NOTE — Telephone Encounter (Signed)
 Mike Mullen

## 2023-11-07 ENCOUNTER — Other Ambulatory Visit: Payer: 59

## 2023-11-10 ENCOUNTER — Telehealth: Payer: Self-pay | Admitting: Hematology

## 2023-11-13 ENCOUNTER — Inpatient Hospital Stay: Attending: Hematology

## 2023-11-13 DIAGNOSIS — C61 Malignant neoplasm of prostate: Secondary | ICD-10-CM | POA: Insufficient documentation

## 2023-11-13 DIAGNOSIS — D75839 Thrombocytosis, unspecified: Secondary | ICD-10-CM | POA: Insufficient documentation

## 2023-11-13 DIAGNOSIS — D473 Essential (hemorrhagic) thrombocythemia: Secondary | ICD-10-CM | POA: Insufficient documentation

## 2023-11-13 DIAGNOSIS — Z7964 Long term (current) use of myelosuppressive agent: Secondary | ICD-10-CM | POA: Insufficient documentation

## 2023-11-14 ENCOUNTER — Inpatient Hospital Stay

## 2023-11-14 ENCOUNTER — Other Ambulatory Visit

## 2023-11-14 ENCOUNTER — Telehealth: Payer: Self-pay | Admitting: Hematology

## 2023-11-14 DIAGNOSIS — C61 Malignant neoplasm of prostate: Secondary | ICD-10-CM | POA: Diagnosis not present

## 2023-11-14 DIAGNOSIS — Z7964 Long term (current) use of myelosuppressive agent: Secondary | ICD-10-CM | POA: Diagnosis not present

## 2023-11-14 DIAGNOSIS — D473 Essential (hemorrhagic) thrombocythemia: Secondary | ICD-10-CM

## 2023-11-14 DIAGNOSIS — D75839 Thrombocytosis, unspecified: Secondary | ICD-10-CM | POA: Diagnosis not present

## 2023-11-14 LAB — CBC WITH DIFFERENTIAL (CANCER CENTER ONLY)
Abs Immature Granulocytes: 0.04 10*3/uL (ref 0.00–0.07)
Basophils Absolute: 0 10*3/uL (ref 0.0–0.1)
Basophils Relative: 0 %
Eosinophils Absolute: 0.1 10*3/uL (ref 0.0–0.5)
Eosinophils Relative: 1 %
HCT: 38.6 % — ABNORMAL LOW (ref 39.0–52.0)
Hemoglobin: 13.3 g/dL (ref 13.0–17.0)
Immature Granulocytes: 1 %
Lymphocytes Relative: 10 %
Lymphs Abs: 0.6 10*3/uL — ABNORMAL LOW (ref 0.7–4.0)
MCH: 35.8 pg — ABNORMAL HIGH (ref 26.0–34.0)
MCHC: 34.5 g/dL (ref 30.0–36.0)
MCV: 104 fL — ABNORMAL HIGH (ref 80.0–100.0)
Monocytes Absolute: 1 10*3/uL (ref 0.1–1.0)
Monocytes Relative: 16 %
Neutro Abs: 4.3 10*3/uL (ref 1.7–7.7)
Neutrophils Relative %: 72 %
Platelet Count: 801 10*3/uL — ABNORMAL HIGH (ref 150–400)
RBC: 3.71 MIL/uL — ABNORMAL LOW (ref 4.22–5.81)
RDW: 11.2 % — ABNORMAL LOW (ref 11.5–15.5)
WBC Count: 6 10*3/uL (ref 4.0–10.5)
nRBC: 0 % (ref 0.0–0.2)

## 2023-11-14 LAB — CMP (CANCER CENTER ONLY)
ALT: 19 U/L (ref 0–44)
AST: 15 U/L (ref 15–41)
Albumin: 4.3 g/dL (ref 3.5–5.0)
Alkaline Phosphatase: 80 U/L (ref 38–126)
Anion gap: 7 (ref 5–15)
BUN: 20 mg/dL (ref 8–23)
CO2: 31 mmol/L (ref 22–32)
Calcium: 9.7 mg/dL (ref 8.9–10.3)
Chloride: 100 mmol/L (ref 98–111)
Creatinine: 1.3 mg/dL — ABNORMAL HIGH (ref 0.61–1.24)
GFR, Estimated: >60 mL/min (ref >60)
Glucose, Bld: 94 mg/dL (ref 70–99)
Potassium: 4.2 mmol/L (ref 3.5–5.1)
Sodium: 138 mmol/L (ref 135–145)
Total Bilirubin: 0.5 mg/dL (ref 0.0–1.2)
Total Protein: 8.2 g/dL — ABNORMAL HIGH (ref 6.5–8.1)

## 2023-11-19 ENCOUNTER — Inpatient Hospital Stay (HOSPITAL_BASED_OUTPATIENT_CLINIC_OR_DEPARTMENT_OTHER): Admitting: Hematology

## 2023-11-19 ENCOUNTER — Telehealth: Payer: Self-pay | Admitting: Hematology

## 2023-11-19 DIAGNOSIS — D473 Essential (hemorrhagic) thrombocythemia: Secondary | ICD-10-CM | POA: Diagnosis not present

## 2023-11-19 MED ORDER — HYDROXYUREA 500 MG PO CAPS: 1500.0000 mg | ORAL_CAPSULE | Freq: Every day | ORAL | 1 refills | Status: DC

## 2023-11-19 NOTE — Progress Notes (Signed)
 [facility]   [phone] [phone]   Clinic Follow up Note   Patient Care Team: System, Provider Not In as PCP - General (Internal Medicine) Mike Palmer, RN as Oncology Nurse Navigator Mike Croak, MD as Consulting Physician (Urology) Mike Payer, MD as Consulting Physician (Radiation Oncology) Mike Mullen, Mike Polio, NP as Nurse Practitioner (Hematology and Oncology) Mike Balk, RN as Registered Nurse Mike Walsenburg, MD as Consulting Physician (Hematology and Oncology) 11/19/2023  I connected with Mike Mullen on 11/19/23 at  3:40 PM EDT by telephone and verified that I am speaking with the correct person using two identifiers.   I discussed the limitations, risks, security and privacy concerns of performing an evaluation and management service by telephone and the availability of in person appointments. I also discussed with the patient that there may be a patient responsible charge related to this service. The patient expressed understanding and agreed to proceed.   Patient's location:  Home  Provider's location:  Office    CHIEF COMPLAINT: follow up ET    CURRENT THERAPY: Hydrea  1000 mg 4 days a week, and 1500 mg 3 days a week   Oncology history Essential thrombocythemia (HCC) -h/o isolated thrombocytosis since at least 2015, never had venous or arterial thrombosis. -reports he was previously evaluated in 2018 while living in [place], had a bone marrow biopsy around that time. On review of his limited provided records (from 08/2018 - 10/2021), he was prescribed Hydrea  500mg  on 12/21/19, but it's not clear if he ever took it. We do not have the results of his BMB; he will try to find these in his records. -presented to ED on 11/15/21 with dizziness. He was found to have platelet count of 927k, the rest of CBC was normal. Brain MRI was negative. -abdomen US  on 11/26/21 was negative. 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 He has been on a reduced dose since last summer due to previously low white blood cell counts, currently taking three tablets on Monday, Wednesday, and Friday, and two tablets on other days. He missed three to four doses recently and is out of medication. - Send prescription [facility] [address], [place] , [place]. - Instruct to continue current hydroxyurea  dosing regimen: three tablets on Monday, Wednesday, and Friday, and two tablets on other days. - Advise to fill the prescription today. - Schedule blood count check in three to four weeks. - Coordinate with [facility] in [place] for blood count if unable to return to [place]. - Plan follow-up appointment in three to four months.  Plan - Recent labs reviewed, significant thrombocytosis, due to missing doses (he run out medication).  I reviewed his Hydrea  at the same dose [facility] in [place], where he lives now. - Repeat lab in 1 month, he will find [facility] and let us  know  - Lab and follow-up in 3 to 4 months   SUMMARY OF ONCOLOGIC HISTORY: Oncology History  Malignant neoplasm of prostate (HCC)  10/31/2022 Cancer Staging  Staging form: Prostate, AJCC 8th Edition - Clinical stage from 10/31/2022: Stage IIB (cT1c, cN0, cM0, PSA: 4.2, Grade Group: 2) - Signed by Mike Pata, PA-C on 12/24/2022 Histopathologic type: Adenocarcinoma, NOS Stage prefix: Initial diagnosis Prostate specific antigen (PSA) range: Less than 10 Gleason primary pattern: 3 Gleason secondary pattern: 4 Gleason score:  7 Histologic grading system: 5 grade system Number of biopsy cores examined: 12 Number of biopsy cores positive: 4 Location of positive needle core biopsies: Both sides   12/24/2022 Initial Diagnosis   Malignant neoplasm of prostate [facility])     Discussed the use of AI scribe software for clinical note transcription with the patient, who gave verbal consent to proceed.  History of Present Illness Mike Mullen is a 63 year old male with essential thrombocytopenia who presents for a follow-up visit.  His platelet count is currently elevated at 801, up from 450 three months ago. He has missed several doses of Hydrea  due to travel and running out of medication. His current regimen is three tablets on Monday, Wednesday, and Friday, and two tablets on other days, adjusted last summer due to low white blood cell count. He recently moved to [place] , [place], but plans to continue care with his current provider.     REVIEW OF SYSTEMS:   Constitutional: Denies fevers, chills or abnormal weight loss Eyes: Denies blurriness of vision Ears, nose, mouth, throat, and face: Denies mucositis or sore throat Respiratory: Denies cough, dyspnea or wheezes Cardiovascular: Denies palpitation, chest discomfort or lower extremity swelling Gastrointestinal:  Denies nausea, heartburn or change in bowel habits Skin: Denies abnormal skin rashes Lymphatics: Denies new lymphadenopathy or easy bruising Neurological:Denies numbness, tingling or new weaknesses Behavioral/Psych: Mood is stable, no new changes  All other systems were reviewed with the patient and are negative.  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   ED (erectile dysfunction)    Elevated PSA 10/31/2022   Hypertension    Prostate cancer (HCC)    Thrombocytopenia (HCC)    followed by dr Mike Mullen at cancer center    SURGICAL HISTORY: Past Surgical History:  Procedure  Laterality Date   HERNIA REPAIR     INGUINAL HERNIA REPAIR Left 02/19/2016   Procedure: OPEN LEFT INGUINAL HERNIA REPAIR;  Surgeon: Jerryl Morin, MD;  Location: [facility];  Service: General;  Laterality: Left;   INSERTION OF MESH Left 02/19/2016   Procedure: INSERTION OF MESH;  Surgeon: Jerryl Morin, MD;  Location: [facility];  Service: General;  Laterality: Left;   PROSTATE BIOPSY     RADIOACTIVE SEED IMPLANT N/A 03/07/2023   Procedure: RADIOACTIVE SEED IMPLANT/BRACHYTHERAPY IMPLANT;  Surgeon: Mike Croak, MD;  Location: [facility];  Service: Urology;  Laterality: N/A;   SPACE OAR INSTILLATION N/A 03/07/2023   Procedure: SPACE OAR INSTILLATION;  Surgeon: Mike Croak, MD;  Location: [facility];  Service: Urology;  Laterality: N/A;    I have reviewed the social history and family history with the patient and they are unchanged from previous note.  ALLERGIES:  has no known allergies.  MEDICATIONS:  Current Outpatient Medications  Medication Sig Dispense Refill   amLODipine (NORVASC) 10 MG tablet Take 1 tablet every day by oral route, for HTN.     emtricitabine-tenofovir (TRUVADA) 200-300 MG tablet TAKE ONE TABLET BY MOUTH ONCE DAILY. STORE IN ORIGINAL CONTAINER AT ROOM TEMPERATURE.     hydrochlorothiazide (HYDRODIURIL)  25 MG tablet Take 1 tablet by mouth daily.     hydroxyurea  (HYDREA ) 500 MG capsule Take 3 capsules (1,500 mg total) by mouth daily. 210 capsule 1   latanoprost (XALATAN) 0.005 % ophthalmic solution Place into both eyes daily.     Semaglutide -Weight Management 0.5 MG/0.5ML SOAJ Take 0.5 mg by mouth once a week. 2 mL 0   sildenafil (VIAGRA) 100 MG tablet Take 100 mg by mouth as needed for erectile dysfunction.     tamsulosin  (FLOMAX ) 0.4 MG CAPS capsule Take 1 capsule (0.4 mg total) by mouth daily. 30 capsule 3   No current facility-administered medications for this visit.    PHYSICAL EXAMINATION: Not  performed   LABORATORY DATA:  I have reviewed the data as listed    Latest Ref Rng & Units 11/14/2023    3:45 PM 08/07/2023   10:51 AM 04/16/2023    9:10 AM  CBC  WBC 4.0 - 10.5 K/uL 6.0  3.5  3.8   Hemoglobin 13.0 - 17.0 g/dL 16.1  09.6  04.5   Hematocrit 39.0 - 52.0 % 38.6  39.7  39.8   Platelets 150 - 400 K/uL 801  451  398         Latest Ref Rng & Units 11/14/2023    3:45 PM 08/07/2023   10:51 AM 04/16/2023    9:10 AM  CMP  Glucose 70 - 99 mg/dL 94  409  811   BUN 8 - 23 mg/dL 20  15  10    Creatinine 0.61 - 1.24 mg/dL 9.14  7.82  9.56   Sodium 135 - 145 mmol/L 138  139  140   Potassium 3.5 - 5.1 mmol/L 4.2  3.9  3.8   Chloride 98 - 111 mmol/L 100  103  104   CO2 22 - 32 mmol/L 31  31  29    Calcium 8.9 - 10.3 mg/dL 9.7  9.4  9.5   Total Protein 6.5 - 8.1 g/dL 8.2  7.3  7.4   Total Bilirubin 0.0 - 1.2 mg/dL 0.5  0.7  0.5   Alkaline Phos 38 - 126 U/L 80  57  66   AST 15 - 41 U/L 15  15  16    ALT 0 - 44 U/L 19  15  16        RADIOGRAPHIC STUDIES: I have personally reviewed the radiological images as listed and agreed with the findings in the report. No results found.     I discussed the assessment and treatment plan with the patient. The patient was provided an opportunity to ask questions and all were answered. The patient agreed with the plan and demonstrated an understanding of the instructions.   The patient was advised to call back or seek an in-person evaluation if the symptoms worsen or if the condition Mullen to improve as anticipated.  I provided 15 minutes of non face-to-face telephone visit time during this encounter, and > 50% was spent counseling as documented under my assessment & plan.     Mike Lakeview, MD 11/19/23

## 2023-11-19 NOTE — Assessment & Plan Note (Signed)
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 -reports he was previously evaluated in 2018 while living in [place], had a bone marrow biopsy around that time. On review of his limited provided records (from 08/2018 - 10/2021), he was prescribed Hydrea  500mg  on 12/21/19, but it's not clear if he ever took it. We do not have the results of his BMB; he will try to find these in his records. -presented to ED on 11/15/21 with dizziness. He was found to have platelet count of 927k, the rest of CBC was normal. Brain MRI was negative. -abdomen US  on 11/26/21 was negative. 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

## 2024-02-05 ENCOUNTER — Other Ambulatory Visit: Payer: 59

## 2024-02-05 ENCOUNTER — Ambulatory Visit: Payer: 59 | Admitting: Hematology

## 2024-02-14 ENCOUNTER — Other Ambulatory Visit: Payer: Self-pay | Admitting: Hematology

## 2024-02-18 NOTE — Assessment & Plan Note (Deleted)
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 -reports he was previously evaluated in 2018 while living in [place], had a bone marrow biopsy around that time. On review of his limited provided records (from 08/2018 - 10/2021), he was prescribed Hydrea  500mg  on 12/21/19, but it's not clear if he ever took it. We do not have the results of his BMB; he will try to find these in his records. -presented to ED on 11/15/21 with dizziness. He was found to have platelet count of 927k, the rest of CBC was normal. Brain MRI was negative. -abdomen US  on 11/26/21 was negative. 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

## 2024-02-19 ENCOUNTER — Inpatient Hospital Stay: Attending: Hematology

## 2024-02-19 ENCOUNTER — Inpatient Hospital Stay: Admitting: Hematology

## 2024-02-19 DIAGNOSIS — D473 Essential (hemorrhagic) thrombocythemia: Secondary | ICD-10-CM

## 2024-03-30 ENCOUNTER — Other Ambulatory Visit: Payer: Self-pay | Admitting: Nurse Practitioner

## 2024-03-30 ENCOUNTER — Other Ambulatory Visit: Payer: Self-pay

## 2024-03-30 DIAGNOSIS — C61 Malignant neoplasm of prostate: Secondary | ICD-10-CM

## 2024-03-30 NOTE — Assessment & Plan Note (Addendum)
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 -reports he was previously evaluated in 2018 while living in [place], had a bone marrow biopsy around that time. On review of his limited provided records (from 08/2018 - 10/2021), he was prescribed Hydrea  500mg  on 12/21/19, but it's not clear if he ever took it. We do not have the results of his BMB; he will try to find these in his records. -presented to ED on 11/15/21 with dizziness. He was found to have platelet count of 927k, the rest of CBC was normal. Brain MRI was negative. -abdomen US  on 11/26/21 was negative. 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 Lanny.  Currently taking Hydrea  1000 mg, 4 days weekly, and Hydrea  1500 mg, 3 days weekly.  Platelet count has improved to 522.  Mild leukopenia with WBC 3.1.  Continue with same dose regimen of Hydrea .

## 2024-03-30 NOTE — Assessment & Plan Note (Addendum)
 Stage IIB, cT1cN0M0, with Gleason score 3+3= 6, baseline PSA 4.2 -Patient declined prostatectomy, he received brachytherapy by Dr. Alvaro in 02/2023. -monitor PSA - 03/31/2024 -PSA normal at 2.5.  He is followed by urology on regular basis.SABRA

## 2024-03-30 NOTE — Progress Notes (Signed)
 Patient Care Team: System, Provider Not In as PCP - General (Internal Medicine) Vertell Pont, RN as Oncology Nurse Navigator Carolee Sherwood JONETTA DOUGLAS, MD as Consulting Physician (Urology) Patrcia Cough, MD as Consulting Physician (Radiation Oncology) Crawford, Morna Pickle, NP as Nurse Practitioner (Hematology and Oncology) Starla Wendelyn JONETTA, RN as Registered Nurse Lanny Callander, MD as Consulting Physician (Hematology and Oncology)  Clinic Day:  03/31/2024  Referring physician: No ref. provider found  ASSESSMENT & PLAN:   Assessment & Plan: Essential thrombocythemia (HCC) -h/o isolated thrombocytosis since at least 2015, never had venous or arterial thrombosis. -reports he was previously evaluated in 2018 while living in [place], had a bone marrow biopsy around that time. On review of his limited provided records (from 08/2018 - 10/2021), he was prescribed Hydrea  500mg  on 12/21/19, but it's not clear if he ever took it. We do not have the results of his BMB; he will try to find these in his records. -presented to ED on 11/15/21 with dizziness. He was found to have platelet count of 927k, the rest of CBC was normal. Brain MRI was negative. -abdomen US  on 11/26/21 was negative. 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 Lanny.  Currently taking Hydrea  1000 mg, 4 days weekly, and Hydrea  1500 mg, 3 days weekly.  Platelet count has improved to 522.  Mild leukopenia with WBC 3.1.  Continue with same dose regimen of Hydrea .    Malignant neoplasm of prostate (HCC) Stage IIB, cT1cN0M0, with Gleason score 3+3= 6, baseline PSA 4.2 -Patient  declined prostatectomy, he received brachytherapy by Dr. Alvaro in 02/2023. -monitor PSA - 03/31/2024 -PSA normal at 2.5.  He is followed by urology on regular basis..    Essential thrombocythemia Slight elevation of platelet count today at 522.  Hgb 14.1 and HCT 40.4.  Reviewed dosing of Hydrea .  Currently taking 1000 mg 4 days weekly and 1500 mg 3 days weekly.  Reluctant to adjust dose of Hydrea  due to leukopenia with WBC 3.1.  Plan to recheck labs in 3 to 4 months with follow-up.  History of prostate cancer Check a PSA is normal at 2.5.  He is followed by urology and sees them on a regular basis.  Plan Labs reviewed. - Improved platelets at 522.  Mild leukopenia with WBC 3.1. - Unremarkable CMP. - PSA normal at 2.5. Continue taking Hydrea  1000 mg, 4 days weekly, and 1500 mg, 3 days weekly. Plan for labs and follow-up in 3 to 4 months. Continue regular follow-ups with urology.  The patient understands the plans discussed today and is in agreement with them.  He knows to contact our office if he develops concerns prior to his next appointment.  I provided 25 minutes of face-to-face time during this encounter and > 50% was spent counseling as documented under my assessment and plan.    Powell FORBES Lessen, NP  [facility] - A DEPT OF [facility] [address] [place] [address] Dept: [phone] Dept Fax: [phone]   No orders of the defined types were placed in this encounter.  CHIEF COMPLAINT:  CC: Essential thrombocythemia; history of prostate cancer  Current Treatment: Hydrea  1000 mg 4 days weekly, 1500 mg 3 days weekly  INTERVAL HISTORY:  Press is here today for repeat clinical assessment.   He last had a phone visit with Dr. Lanny on 11/19/2023.  He continues to do well with current dosing of Hydrea .  Denies negative side effects related to medication.  He denies any abnormal bleeding or bruising.  He  denies chest pain, chest pressure, or shortness of breath. He denies headaches or visual disturbances. He denies abdominal pain, nausea, vomiting, or changes in bowel or bladder habits.  denies fevers or chills. He denies pain. His appetite is good. His weight has decreased 6 pounds over last 8 months.  I have reviewed the past medical history, past surgical history, social history and family history with the patient and they are unchanged from previous note.  ALLERGIES:  has no known allergies.  MEDICATIONS:  Current Outpatient Medications  Medication Sig Dispense Refill   amLODipine (NORVASC) 10 MG tablet Take 1 tablet every day by oral route, for HTN.     emtricitabine-tenofovir (TRUVADA) 200-300 MG tablet TAKE ONE TABLET BY MOUTH ONCE DAILY. STORE IN ORIGINAL CONTAINER AT ROOM TEMPERATURE.     hydrochlorothiazide (HYDRODIURIL) 25 MG tablet Take 1 tablet by mouth daily.     hydroxyurea  (HYDREA ) 500 MG capsule TAKE 3 CAPSULES (1,500 MG TOTAL) BY MOUTH DAILY 270 capsule 1   latanoprost (XALATAN) 0.005 % ophthalmic solution Place into both eyes daily.     Semaglutide -Weight Management 0.5 MG/0.5ML SOAJ Take 0.5 mg by mouth once a week. 2 mL 0   sildenafil (VIAGRA) 100 MG tablet Take 100 mg by mouth as needed for erectile dysfunction.     tamsulosin  (FLOMAX ) 0.4 MG CAPS capsule Take 1 capsule (0.4 mg total) by mouth daily. 30 capsule 3   No current facility-administered medications for this visit.    HISTORY OF PRESENT ILLNESS:   Oncology History  Malignant neoplasm of prostate (HCC)  10/31/2022 Cancer Staging   Staging form: Prostate, AJCC 8th Edition - Clinical stage from 10/31/2022: Stage IIB (cT1c, cN0, cM0, PSA: 4.2, Grade Group: 2) - Signed by Sherwood Rise, PA-C on 12/24/2022 Histopathologic type: Adenocarcinoma, NOS Stage prefix: Initial diagnosis Prostate specific antigen (PSA) range: Less than 10 Gleason primary pattern: 3 Gleason secondary pattern: 4 Gleason score:  7 Histologic grading system: 5 grade system Number of biopsy cores examined: 12 Number of biopsy cores positive: 4 Location of positive needle core biopsies: Both sides   12/24/2022 Initial Diagnosis   Malignant neoplasm of prostate (HCC)       REVIEW OF SYSTEMS:   Constitutional: Denies fevers, chills or abnormal weight loss Eyes: Denies blurriness of vision Ears, nose, mouth, throat, and face: Denies mucositis or sore throat Respiratory: Denies cough, dyspnea or wheezes Cardiovascular: Denies palpitation, chest discomfort or lower extremity swelling Gastrointestinal:  Denies nausea, heartburn or change in bowel habits Skin: Denies abnormal skin rashes Lymphatics: Denies new lymphadenopathy or easy bruising Neurological:Denies numbness, tingling or new weaknesses Behavioral/Psych: Mood is stable, no new changes  All other systems were reviewed with the patient and are negative.   VITALS:   Today's Vitals   03/31/24 1200 03/31/24 1250  BP:  118/72  Pulse:  87  Resp:  17  Temp:  98.4 F (36.9 C)  TempSrc:  Temporal  SpO2:  98%  Weight:  231 lb 3.2 oz (104.9 kg)  PainSc: 0-No pain  Body mass index is 28.14 kg/m.    Wt Readings from Last 3 Encounters:  03/31/24 231 lb 3.2 oz (104.9 kg)  08/07/23 237 lb 6.4 oz (107.7 kg)  03/27/23 235 lb (106.6 kg)    Body mass index is 28.14 kg/m.  Performance status (ECOG): 0 - Asymptomatic  PHYSICAL EXAM:   GENERAL:alert, no distress and comfortable SKIN: skin color, texture, turgor are normal, no rashes or significant lesions EYES: normal, Conjunctiva are pink and non-injected, sclera clear OROPHARYNX:no exudate, no erythema and lips, buccal mucosa, and tongue normal  NECK: supple, thyroid normal size, non-tender, without nodularity LYMPH:  no palpable lymphadenopathy in the cervical, axillary or inguinal LUNGS: clear to auscultation and percussion with normal breathing effort HEART: regular rate & rhythm and no murmurs  and no lower extremity edema ABDOMEN:abdomen soft, non-tender and normal bowel sounds Musculoskeletal:no cyanosis of digits and no clubbing  NEURO: alert & oriented x 3 with fluent speech, no focal motor/sensory deficits  LABORATORY DATA:  I have reviewed the data as listed    Component Value Date/Time   NA 138 03/31/2024 1229   K 4.1 03/31/2024 1229   CL 105 03/31/2024 1229   CO2 29 03/31/2024 1229   GLUCOSE 88 03/31/2024 1229   BUN 11 03/31/2024 1229   CREATININE 0.98 03/31/2024 1229   CALCIUM 9.0 03/31/2024 1229   PROT 7.7 03/31/2024 1229   ALBUMIN 4.4 03/31/2024 1229   AST 15 03/31/2024 1229   ALT 15 03/31/2024 1229   ALKPHOS 69 03/31/2024 1229   BILITOT 0.6 03/31/2024 1229   GFRNONAA >60 03/31/2024 1229   GFRAA >60 02/16/2016 1035     Lab Results  Component Value Date   WBC 3.1 (L) 03/31/2024   NEUTROABS 2.0 03/31/2024   HGB 14.1 03/31/2024   HCT 40.4 03/31/2024   MCV 105.5 (H) 03/31/2024   PLT 522 (H) 03/31/2024   Lab Results  Component Value Date   PSA1 2.5 03/31/2024

## 2024-03-31 ENCOUNTER — Inpatient Hospital Stay: Attending: Hematology

## 2024-03-31 ENCOUNTER — Inpatient Hospital Stay: Admitting: Nurse Practitioner

## 2024-03-31 VITALS — BP 118/72 | HR 87 | Temp 98.4°F | Resp 17 | Wt 231.2 lb

## 2024-03-31 DIAGNOSIS — D473 Essential (hemorrhagic) thrombocythemia: Secondary | ICD-10-CM | POA: Insufficient documentation

## 2024-03-31 DIAGNOSIS — C61 Malignant neoplasm of prostate: Secondary | ICD-10-CM | POA: Insufficient documentation

## 2024-03-31 DIAGNOSIS — Z7964 Long term (current) use of myelosuppressive agent: Secondary | ICD-10-CM | POA: Diagnosis not present

## 2024-03-31 LAB — CBC WITH DIFFERENTIAL (CANCER CENTER ONLY)
Abs Immature Granulocytes: 0.01 K/uL (ref 0.00–0.07)
Basophils Absolute: 0 K/uL (ref 0.0–0.1)
Basophils Relative: 0 %
Eosinophils Absolute: 0.1 K/uL (ref 0.0–0.5)
Eosinophils Relative: 2 %
HCT: 40.4 % (ref 39.0–52.0)
Hemoglobin: 14.1 g/dL (ref 13.0–17.0)
Immature Granulocytes: 0 %
Lymphocytes Relative: 18 %
Lymphs Abs: 0.5 K/uL — ABNORMAL LOW (ref 0.7–4.0)
MCH: 36.8 pg — ABNORMAL HIGH (ref 26.0–34.0)
MCHC: 34.9 g/dL (ref 30.0–36.0)
MCV: 105.5 fL — ABNORMAL HIGH (ref 80.0–100.0)
Monocytes Absolute: 0.4 K/uL (ref 0.1–1.0)
Monocytes Relative: 13 %
Neutro Abs: 2 K/uL (ref 1.7–7.7)
Neutrophils Relative %: 67 %
Platelet Count: 522 K/uL — ABNORMAL HIGH (ref 150–400)
RBC: 3.83 MIL/uL — ABNORMAL LOW (ref 4.22–5.81)
RDW: 12.6 % (ref 11.5–15.5)
WBC Count: 3.1 K/uL — ABNORMAL LOW (ref 4.0–10.5)
nRBC: 0 % (ref 0.0–0.2)

## 2024-03-31 LAB — CMP (CANCER CENTER ONLY)
ALT: 15 U/L (ref 0–44)
AST: 15 U/L (ref 15–41)
Albumin: 4.4 g/dL (ref 3.5–5.0)
Alkaline Phosphatase: 69 U/L (ref 38–126)
Anion gap: 4 — ABNORMAL LOW (ref 5–15)
BUN: 11 mg/dL (ref 8–23)
CO2: 29 mmol/L (ref 22–32)
Calcium: 9 mg/dL (ref 8.9–10.3)
Chloride: 105 mmol/L (ref 98–111)
Creatinine: 0.98 mg/dL (ref 0.61–1.24)
GFR, Estimated: >60 mL/min (ref >60)
Glucose, Bld: 88 mg/dL (ref 70–99)
Potassium: 4.1 mmol/L (ref 3.5–5.1)
Sodium: 138 mmol/L (ref 135–145)
Total Bilirubin: 0.6 mg/dL (ref 0.0–1.2)
Total Protein: 7.7 g/dL (ref 6.5–8.1)

## 2024-04-01 LAB — PROSTATE-SPECIFIC AG, SERUM (LABCORP): Prostate Specific Ag, Serum: 2.5 ng/mL (ref 0.0–4.0)

## 2024-04-11 ENCOUNTER — Encounter: Payer: Self-pay | Admitting: Nurse Practitioner

## 2024-07-30 ENCOUNTER — Other Ambulatory Visit: Payer: Self-pay

## 2024-07-30 DIAGNOSIS — C61 Malignant neoplasm of prostate: Secondary | ICD-10-CM

## 2024-07-30 DIAGNOSIS — D473 Essential (hemorrhagic) thrombocythemia: Secondary | ICD-10-CM

## 2024-08-02 ENCOUNTER — Ambulatory Visit: Admitting: Hematology

## 2024-08-02 ENCOUNTER — Inpatient Hospital Stay

## 2024-08-07 ENCOUNTER — Other Ambulatory Visit: Payer: Self-pay | Admitting: Hematology
